# Patient Record
Sex: Female | Born: 1990 | Race: Black or African American | Hispanic: No | Marital: Single | Smoking: Never smoker
Health system: Southern US, Community
[De-identification: ages and names within clinical notes are randomized; demographics above are authoritative.]

## PROBLEM LIST (undated history)

## (undated) ENCOUNTER — Inpatient Hospital Stay: Payer: Self-pay

## (undated) DIAGNOSIS — Z789 Other specified health status: Secondary | ICD-10-CM

## (undated) DIAGNOSIS — E669 Obesity, unspecified: Secondary | ICD-10-CM

## (undated) DIAGNOSIS — Z8759 Personal history of other complications of pregnancy, childbirth and the puerperium: Secondary | ICD-10-CM

## (undated) HISTORY — DX: Personal history of other complications of pregnancy, childbirth and the puerperium: Z87.59

## (undated) HISTORY — DX: Obesity, unspecified: E66.9

## (undated) HISTORY — PX: MOUTH SURGERY: SHX715

---

## 2016-08-10 ENCOUNTER — Emergency Department
Admission: EM | Admit: 2016-08-10 | Discharge: 2016-08-10 | Disposition: A | Discharge status: Home / Self Care | Payer: Self-pay | Attending: Emergency Medicine | Admitting: Emergency Medicine

## 2016-08-10 DIAGNOSIS — N3001 Acute cystitis with hematuria: Secondary | ICD-10-CM | POA: Insufficient documentation

## 2016-08-10 LAB — URINALYSIS, COMPLETE (UACMP) WITH MICROSCOPIC
Bilirubin Urine: NEGATIVE
GLUCOSE, UA: NEGATIVE mg/dL
KETONES UR: NEGATIVE mg/dL
Nitrite: POSITIVE — AB
PROTEIN: 30 mg/dL — AB
Specific Gravity, Urine: 1.031 — ABNORMAL HIGH (ref 1.005–1.030)
pH: 5 (ref 5.0–8.0)

## 2016-08-10 LAB — POCT PREGNANCY, URINE: PREG TEST UR: NEGATIVE

## 2016-08-10 MED ORDER — SULFAMETHOXAZOLE-TRIMETHOPRIM 800-160 MG PO TABS
1.0000 | ORAL_TABLET | Freq: Once | ORAL | Status: AC
  Administered 2016-08-10: 1 via ORAL
  Filled 2016-08-10: qty 1

## 2016-08-10 MED ORDER — SULFAMETHOXAZOLE-TRIMETHOPRIM 800-160 MG PO TABS: 1.0000 | ORAL_TABLET | Freq: Two times a day (BID) | ORAL | 0 refills | Status: AC

## 2016-08-10 NOTE — ED Notes (Signed)

## 2016-08-10 NOTE — ED Provider Notes (Signed)
[facility] Emergency Department Provider Note  ____________________________________________  Time seen: Approximately 11:18 PM  I have reviewed the triage vital signs and the nursing notes.   HISTORY  Chief Complaint Urinary Frequency    HPI Eileen Parsons is a 26 y.o. female presenting to the emergency department with 1 day of dysuria, increased urinary frequency and suprapubic pain. Patient denies flank pain, nausea, vomiting, abdominal pain or fever. Patient states that her last urinary tract infection was "several years ago". No history of pyelonephritis or nephrolithiasis. No changes in vaginal discharge. No alleviating measures have been attempted.   No past medical history on file.  There are no active problems to display for this patient.   No past surgical history on file.  Prior to Admission medications   Medication Sig Start Date End Date Taking? Authorizing Provider  sulfamethoxazole-trimethoprim (BACTRIM DS,SEPTRA DS) 800-160 MG tablet Take 1 tablet by mouth 2 (two) times daily. 08/10/16 08/17/16  Orvil FeilWoods, Renato Spellman M, PA-C    Allergies Patient has no known allergies.  No family history on file.  Social History Social History  Substance Use Topics  . Smoking status: Not on file  . Smokeless tobacco: Not on file  . Alcohol use Not on file     Review of Systems  Constitutional: No fever/chills Eyes: No visual changes. No discharge ENT: No upper respiratory complaints. Cardiovascular: no chest pain. Respiratory: no cough. No SOB. Gastrointestinal: No abdominal pain.  No nausea, no vomiting.  No diarrhea.  No constipation. Genitourinary: Patient has dysuria and increased urinary frequency. Musculoskeletal: Negative for musculoskeletal pain. Skin: Negative for rash, abrasions, lacerations, ecchymosis. Neurological: Negative for headaches, focal weakness or numbness.  ____________________________________________   PHYSICAL  EXAM:  VITAL SIGNS: ED Triage Vitals  Enc Vitals Group     BP 08/10/16 2236 134/65     Pulse Rate 08/10/16 2232 86     Resp 08/10/16 2232 18     Temp 08/10/16 2232 99.1 F (37.3 C)     Temp Source 08/10/16 2232 Oral     SpO2 08/10/16 2232 98 %     Weight 08/10/16 2234 250 lb (113.4 kg)     Height 08/10/16 2234 5\' 7"  (1.702 m)     Head Circumference --      Peak Flow --      Pain Score 08/10/16 2231 5     Pain Loc --      Pain Edu? --      Excl. in GC? --      Constitutional: Alert and oriented. Well appearing and in no acute distress. Eyes: Conjunctivae are normal. PERRL. EOMI. Head: Atraumatic. Cardiovascular: Normal rate, regular rhythm. Normal S1 and S2.  Good peripheral circulation. Respiratory: Normal respiratory effort without tachypnea or retractions. Lungs CTAB. Good air entry to the bases with no decreased or absent breath sounds. Gastrointestinal: Bowel sounds 4 quadrants. Patient has suprapubic pain. No guarding or rigidity. No palpable masses. No distention. No CVA tenderness. Musculoskeletal: Full range of motion to all extremities. No gross deformities appreciated. Neurologic:  Normal speech and language. No gross focal neurologic deficits are appreciated.  Skin:  Skin is warm, dry and intact. No rash noted. Psychiatric: Mood and affect are normal. Speech and behavior are normal. Patient exhibits appropriate insight and judgement.   ____________________________________________   LABS (all labs ordered are listed, but only abnormal results are displayed)  Labs Reviewed  URINALYSIS, COMPLETE (UACMP) WITH MICROSCOPIC - Abnormal; Notable for the following:  Result Value   Color, Urine YELLOW (*)    APPearance CLOUDY (*)    Specific Gravity, Urine 1.031 (*)    Hgb urine dipstick MODERATE (*)    Protein, ur 30 (*)    Nitrite POSITIVE (*)    Leukocytes, UA LARGE (*)    Bacteria, UA RARE (*)    Squamous Epithelial / LPF 6-30 (*)    All other components  within normal limits  POC URINE PREG, ED  POCT PREGNANCY, URINE   ____________________________________________  EKG   ____________________________________________  RADIOLOGY   No results found.  ____________________________________________    PROCEDURES  Procedure(s) performed:    Procedures    Medications  sulfamethoxazole-trimethoprim (BACTRIM DS,SEPTRA DS) 800-160 MG per tablet 1 tablet (not administered)     ____________________________________________   INITIAL IMPRESSION / ASSESSMENT AND PLAN / ED COURSE  Pertinent labs & imaging results that were available during my care of the patient were reviewed by me and considered in my medical decision making (see chart for details).  Review of the [place] CSRS was performed in accordance of the [facility] prior to dispensing any controlled drugs.    Assessment and plan Acute cystitis Patient presents to the emergency department with dysuria and increased urinary frequency for 1 day. Urinalysis conducted in the emergency department is consistent with acute cystitis. As patient does not have a history of nephrolithiasis and is without bilateral flank pain, CT renal stone study was not conducted at this time. Strict return precautions were given to the patient. Patient was advised to return for the sudden onset of bilateral flank pain, nausea and vomiting. Patient voiced understanding regarding these return precautions. Vital signs are reassuring prior to discharge. All patient questions were answered.    ____________________________________________  FINAL CLINICAL IMPRESSION(S) / ED DIAGNOSES  Final diagnoses:  Acute cystitis with hematuria      NEW MEDICATIONS STARTED DURING THIS VISIT:  New Prescriptions   SULFAMETHOXAZOLE-TRIMETHOPRIM (BACTRIM DS,SEPTRA DS) 800-160 MG TABLET    Take 1 tablet by mouth 2 (two) times daily.        This chart was dictated using voice recognition software/Dragon. Despite best  efforts to proofread, errors can occur which can change the meaning. Any change was purely unintentional.    Orvil FeilWoods, Tausha Milhoan M, PA-C 08/10/16 2324    Don PerkingVeronese, [place], MD 08/10/16 [phone]

## 2016-08-10 NOTE — ED Notes (Signed)
poct pregnancy Negative 

## 2016-08-10 NOTE — ED Triage Notes (Signed)
Pt has urinary frequency for 1 day.  Intermittent back pain.  No vag bleeding.  Pt alert.

## 2017-03-14 ENCOUNTER — Other Ambulatory Visit: Payer: Self-pay

## 2017-03-14 ENCOUNTER — Encounter: Payer: Self-pay | Admitting: Intensive Care

## 2017-03-14 ENCOUNTER — Emergency Department
Admission: EM | Admit: 2017-03-14 | Discharge: 2017-03-14 | Disposition: A | Discharge status: Home / Self Care | Payer: Self-pay | Attending: Emergency Medicine | Admitting: Emergency Medicine

## 2017-03-14 ENCOUNTER — Emergency Department: Payer: Self-pay

## 2017-03-14 DIAGNOSIS — N3001 Acute cystitis with hematuria: Secondary | ICD-10-CM

## 2017-03-14 LAB — URINALYSIS, COMPLETE (UACMP) WITH MICROSCOPIC
Bacteria, UA: NONE SEEN
Bilirubin Urine: NEGATIVE
Glucose, UA: NEGATIVE mg/dL
Ketones, ur: NEGATIVE mg/dL
NITRITE: NEGATIVE
PH: 5 (ref 5.0–8.0)
Protein, ur: NEGATIVE mg/dL
Specific Gravity, Urine: 1.02 (ref 1.005–1.030)

## 2017-03-14 MED ORDER — PHENAZOPYRIDINE HCL 100 MG PO TABS: 100.0000 mg | ORAL_TABLET | Freq: Three times a day (TID) | ORAL | 0 refills | Status: AC | PRN

## 2017-03-14 MED ORDER — CEPHALEXIN 500 MG PO CAPS: 500.0000 mg | ORAL_CAPSULE | Freq: Two times a day (BID) | ORAL | 0 refills | Status: AC

## 2017-03-14 NOTE — ED Notes (Signed)
See triage note  States she developed dysuria and freq yesterday   Denies any fever or vaginal discharge

## 2017-03-14 NOTE — ED Provider Notes (Signed)
[facility] Emergency Department Provider Note  ____________________________________________  Time seen: Approximately 8:53 AM  I have reviewed the triage vital signs and the nursing notes.   HISTORY  Chief Complaint Urinary Tract Infection    HPI Eileen Parsons is a 27 y.o. female that presents to the emergency department for evaluation of dysuria for 1 day.  She has had some central back pain.  Last UTI was about 1 year ago.  She was told last year that she had blood in her urine when she had a UTI.  No history of kidney stones.  No fever, chills, nausea, vomiting, abdominal pain, vaginal discharge.  History reviewed. No pertinent past medical history.  There are no active problems to display for this patient.   History reviewed. No pertinent surgical history.  Prior to Admission medications   Medication Sig Start Date End Date Taking? Authorizing Provider  cephALEXin (KEFLEX) 500 MG capsule Take 1 capsule (500 mg total) by mouth 2 (two) times daily for 10 days. 03/14/17 03/24/17  Enid DerryWagner, Lilja Soland, PA-C  phenazopyridine (PYRIDIUM) 100 MG tablet Take 1 tablet (100 mg total) by mouth 3 (three) times daily as needed for up to 2 days for pain. 03/14/17 03/16/17  Enid DerryWagner, Shameika Speelman, PA-C    Allergies Patient has no known allergies.  History reviewed. No pertinent family history.  Social History Social History   Tobacco Use  . Smoking status: Never Smoker  . Smokeless tobacco: Never Used  Substance Use Topics  . Alcohol use: Yes    Comment: weekends  . Drug use: No     Review of Systems  Constitutional: No fever/chills ENT: No upper respiratory complaints. Cardiovascular: No chest pain. Respiratory: No SOB. Gastrointestinal: No abdominal pain.  No nausea, no vomiting.  Genitourinary: Positive for dysuria. Musculoskeletal: Positive for back pain Skin: Negative for rash, abrasions, lacerations,  ecchymosis.   ____________________________________________   PHYSICAL EXAM:  VITAL SIGNS: ED Triage Vitals  Enc Vitals Group     BP 03/14/17 0753 (!) 119/53     Pulse Rate 03/14/17 0753 82     Resp 03/14/17 0753 16     Temp 03/14/17 0753 98.1 F (36.7 C)     Temp Source 03/14/17 0753 Oral     SpO2 03/14/17 0753 100 %     Weight 03/14/17 0750 240 lb (108.9 kg)     Height 03/14/17 0750 5\' 7"  (1.702 m)     Head Circumference --      Peak Flow --      Pain Score 03/14/17 0750 5     Pain Loc --      Pain Edu? --      Excl. in GC? --      Constitutional: Alert and oriented. Well appearing and in no acute distress. Eyes: Conjunctivae are normal. PERRL. EOMI. Head: Atraumatic. ENT:      Ears:      Nose: No congestion/rhinnorhea.      Mouth/Throat: Mucous membranes are moist.  Neck: No stridor.  Cardiovascular: Normal rate, regular rhythm.  Good peripheral circulation. Respiratory: Normal respiratory effort without tachypnea or retractions. Lungs CTAB. Good air entry to the bases with no decreased or absent breath sounds. Gastrointestinal: Bowel sounds 4 quadrants. Soft and nontender to palpation. No guarding or rigidity. No palpable masses. No distention. No CVA tenderness. Musculoskeletal: Full range of motion to all extremities. No gross deformities appreciated.  Mild tenderness to palpation to mid lower back.   Neurologic:  Normal speech and language.  No gross focal neurologic deficits are appreciated.  Skin:  Skin is warm, dry and intact. No rash noted.   ____________________________________________   LABS (all labs ordered are listed, but only abnormal results are displayed)  Labs Reviewed  URINALYSIS, COMPLETE (UACMP) WITH MICROSCOPIC - Abnormal; Notable for the following components:      Result Value   Color, Urine YELLOW (*)    APPearance HAZY (*)    Hgb urine dipstick MODERATE (*)    Leukocytes, UA SMALL (*)    Squamous Epithelial / LPF 0-5 (*)    All other  components within normal limits  POC URINE PREG, ED   ____________________________________________  EKG   ____________________________________________  RADIOLOGY   Ct Renal Stone Study  Result Date: 03/14/2017 CLINICAL DATA:  Hematuria.  Dysuria. EXAM: CT ABDOMEN AND PELVIS WITHOUT CONTRAST TECHNIQUE: Multidetector CT imaging of the abdomen and pelvis was performed following the standard protocol without IV contrast. COMPARISON:  None. FINDINGS: Lower chest: No significant pulmonary nodules or acute consolidative airspace disease. Hepatobiliary: Normal liver size. No liver mass. Cholelithiasis. No gallbladder distention, gallbladder wall thickening or pericholecystic fluid. No biliary ductal dilatation. Pancreas: Normal, with no mass or duct dilation. Spleen: Normal size. No mass. Adrenals/Urinary Tract: Normal adrenals. No hydronephrosis. No renal stones. No contour deforming renal masses. Normal caliber ureters, with no ureteral stones. Normal nondistended bladder. Stomach/Bowel: Normal non-distended stomach. Normal caliber small bowel with no small bowel wall thickening. Normal appendix. Normal large bowel with no diverticulosis, large bowel wall thickening or pericolonic fat stranding. Vascular/Lymphatic: Normal caliber abdominal aorta. No pathologically enlarged lymph nodes in the abdomen or pelvis. Reproductive: Normal uterus.  No adnexal mass. Other: No pneumoperitoneum, ascites or focal fluid collection. Musculoskeletal: No aggressive appearing focal osseous lesions. IMPRESSION: No acute abnormality.  No hydronephrosis.  No urolithiasis. Cholelithiasis. Electronically Signed   By: Delbert Phenix M.D.   On: 03/14/2017 09:24    ____________________________________________    PROCEDURES  Procedure(s) performed:    Procedures    Medications - No data to display   ____________________________________________   INITIAL IMPRESSION / ASSESSMENT AND PLAN / ED COURSE  Pertinent  labs & imaging results that were available during my care of the patient were reviewed by me and considered in my medical decision making (see chart for details).  Review of the [place] CSRS was performed in accordance of the [facility] prior to dispensing any controlled drugs.   Patient's diagnosis is consistent with UTI.  Vital signs and exam are reassuring.  Urinalysis shows hematuria and leukocytes.  CT renal stone study negative for nephrolithiasis.  Patient will be discharged home with prescriptions for Keflex and Pyridium. Patient is to follow up with PCP as directed. Patient is given ED precautions to return to the ED for any worsening or new symptoms.     ____________________________________________  FINAL CLINICAL IMPRESSION(S) / ED DIAGNOSES  Final diagnoses:  Acute cystitis with hematuria      NEW MEDICATIONS STARTED DURING THIS VISIT:  ED Discharge Orders        Ordered    cephALEXin (KEFLEX) 500 MG capsule  2 times daily     03/14/17 1007    phenazopyridine (PYRIDIUM) 100 MG tablet  3 times daily PRN     03/14/17 1007          This chart was dictated using voice recognition software/Dragon. Despite best efforts to proofread, errors can occur which can change the meaning. Any change was purely unintentional.    Enid Derry,  PA-C 03/14/17 1030    Rockne Menghini, MD 03/14/17 1309

## 2017-03-14 NOTE — ED Triage Notes (Signed)
Patient c/o burning and slight discomfort when urinating. Denies abnormal d/c

## 2017-03-14 NOTE — ED Notes (Signed)
POC urine pregnancy negative. 

## 2017-03-16 LAB — POCT PREGNANCY, URINE: Preg Test, Ur: NEGATIVE

## 2017-04-27 ENCOUNTER — Encounter: Payer: Self-pay | Admitting: Emergency Medicine

## 2017-04-27 ENCOUNTER — Emergency Department
Admission: EM | Admit: 2017-04-27 | Discharge: 2017-04-27 | Disposition: A | Discharge status: Home / Self Care | Payer: Medicaid Other | Attending: Emergency Medicine | Admitting: Emergency Medicine

## 2017-04-27 ENCOUNTER — Other Ambulatory Visit: Payer: Self-pay

## 2017-04-27 DIAGNOSIS — R109 Unspecified abdominal pain: Secondary | ICD-10-CM | POA: Diagnosis not present

## 2017-04-27 DIAGNOSIS — Z3A00 Weeks of gestation of pregnancy not specified: Secondary | ICD-10-CM | POA: Diagnosis not present

## 2017-04-27 DIAGNOSIS — O26899 Other specified pregnancy related conditions, unspecified trimester: Secondary | ICD-10-CM | POA: Diagnosis present

## 2017-04-27 DIAGNOSIS — R110 Nausea: Secondary | ICD-10-CM | POA: Diagnosis not present

## 2017-04-27 DIAGNOSIS — O26891 Other specified pregnancy related conditions, first trimester: Secondary | ICD-10-CM | POA: Insufficient documentation

## 2017-04-27 DIAGNOSIS — Z3491 Encounter for supervision of normal pregnancy, unspecified, first trimester: Secondary | ICD-10-CM

## 2017-04-27 LAB — URINALYSIS, COMPLETE (UACMP) WITH MICROSCOPIC
BACTERIA UA: NONE SEEN
BILIRUBIN URINE: NEGATIVE
Glucose, UA: NEGATIVE mg/dL
Ketones, ur: NEGATIVE mg/dL
LEUKOCYTES UA: NEGATIVE
Nitrite: NEGATIVE
Protein, ur: NEGATIVE mg/dL
SPECIFIC GRAVITY, URINE: 1.025 (ref 1.005–1.030)
pH: 6 (ref 5.0–8.0)

## 2017-04-27 LAB — CBC
HEMATOCRIT: 39.1 % (ref 35.0–47.0)
Hemoglobin: 13.4 g/dL (ref 12.0–16.0)
MCH: 28.8 pg (ref 26.0–34.0)
MCHC: 34.3 g/dL (ref 32.0–36.0)
MCV: 84 fL (ref 80.0–100.0)
Platelets: 397 10*3/uL (ref 150–440)
RBC: 4.65 MIL/uL (ref 3.80–5.20)
RDW: 13.3 % (ref 11.5–14.5)
WBC: 10.9 10*3/uL (ref 3.6–11.0)

## 2017-04-27 LAB — COMPREHENSIVE METABOLIC PANEL
ALBUMIN: 4 g/dL (ref 3.5–5.0)
ALT: 14 U/L (ref 14–54)
AST: 20 U/L (ref 15–41)
Alkaline Phosphatase: 66 U/L (ref 38–126)
Anion gap: 7 (ref 5–15)
BUN: 12 mg/dL (ref 6–20)
CHLORIDE: 102 mmol/L (ref 101–111)
CO2: 26 mmol/L (ref 22–32)
CREATININE: 0.6 mg/dL (ref 0.44–1.00)
Calcium: 9 mg/dL (ref 8.9–10.3)
GFR calc Af Amer: >60 mL/min (ref >60)
GFR calc non Af Amer: >60 mL/min (ref >60)
GLUCOSE: 83 mg/dL (ref 65–99)
POTASSIUM: 3.8 mmol/L (ref 3.5–5.1)
Sodium: 135 mmol/L (ref 135–145)
Total Bilirubin: 0.4 mg/dL (ref 0.3–1.2)
Total Protein: 8.2 g/dL — ABNORMAL HIGH (ref 6.5–8.1)

## 2017-04-27 LAB — LIPASE, BLOOD: LIPASE: 25 U/L (ref 11–51)

## 2017-04-27 LAB — POCT PREGNANCY, URINE: PREG TEST UR: POSITIVE — AB

## 2017-04-27 MED ORDER — PRENATAL 19 PO TABS
1.0000 | ORAL_TABLET | Freq: Every day | ORAL | 6 refills | Status: DC
Start: 2017-04-27 — End: 2018-03-27

## 2017-04-27 MED ORDER — ONDANSETRON 4 MG PO TBDP
4.0000 mg | ORAL_TABLET | Freq: Once | ORAL | Status: AC
  Administered 2017-04-27: 4 mg via ORAL
  Filled 2017-04-27: qty 1

## 2017-04-27 MED ORDER — ONDANSETRON 4 MG PO TBDP: 4.0000 mg | ORAL_TABLET | Freq: Three times a day (TID) | ORAL | 0 refills | Status: DC | PRN

## 2017-04-27 NOTE — ED Triage Notes (Signed)
FIRST NURSE NOTE-here for abdominal pain. Ambulatory. NAD

## 2017-04-27 NOTE — ED Provider Notes (Signed)
[facility] Emergency Department Provider Note       Time seen: ----------------------------------------- 8:28 PM on 04/27/2017 -----------------------------------------   I have reviewed the triage vital signs and the nursing notes.  HISTORY   Chief Complaint Abdominal Pain    HPI Eileen Parsons is a 27 y.o. female with no significant past medical history who presents to the ED for abdominal pain and nausea for the past week.  Patient denies any vomiting or diarrhea or urinary frequency.  Nothing makes her symptoms better or worse.  Patient states she could possibly be pregnant, she is not actively trying not to get pregnant.  History reviewed. No pertinent past medical history.  There are no active problems to display for this patient.   History reviewed. No pertinent surgical history.  Allergies Patient has no known allergies.  Social History Social History   Tobacco Use  . Smoking status: Never Smoker  . Smokeless tobacco: Never Used  Substance Use Topics  . Alcohol use: Yes    Comment: weekends  . Drug use: No   Review of Systems Constitutional: Negative for fever. Cardiovascular: Negative for chest pain. Respiratory: Negative for shortness of breath. Gastrointestinal: Positive for abdominal pain, nausea Genitourinary: Negative for dysuria. Musculoskeletal: Negative for back pain. Skin: Negative for rash. Neurological: Negative for headaches, focal weakness or numbness.  All systems negative/normal/unremarkable except as stated in the HPI  ____________________________________________   PHYSICAL EXAM:  VITAL SIGNS: ED Triage Vitals [04/27/17 1833]  Enc Vitals Group     BP      Pulse      Resp      Temp      Temp src      SpO2      Weight 250 lb (113.4 kg)     Height 5\' 8"  (1.727 m)     Head Circumference      Peak Flow      Pain Score 5     Pain Loc      Pain Edu?      Excl. in GC?    Constitutional: Alert and  oriented. Well appearing and in no distress. Eyes: Conjunctivae are normal. Normal extraocular movements. ENT   Head: Normocephalic and atraumatic.   Nose: No congestion/rhinnorhea.   Mouth/Throat: Mucous membranes are moist.   Neck: No stridor. Cardiovascular: Normal rate, regular rhythm. No murmurs, rubs, or gallops. Respiratory: Normal respiratory effort without tachypnea nor retractions. Breath sounds are clear and equal bilaterally. No wheezes/rales/rhonchi. Gastrointestinal: Soft and nontender. Normal bowel sounds Musculoskeletal: Nontender with normal range of motion in extremities. No lower extremity tenderness nor edema. Neurologic:  Normal speech and language. No gross focal neurologic deficits are appreciated.  Skin:  Skin is warm, dry and intact. No rash noted. Psychiatric: Mood and affect are normal. Speech and behavior are normal.  ____________________________________________  ED COURSE:  As part of my medical decision making, I reviewed the following data within the electronic MEDICAL RECORD NUMBER History obtained from family if available, nursing notes, old chart and ekg, as well as notes from prior ED visits. Patient presented for abdominal pain and nausea, we will assess with labs and imaging as indicated at this time.   Procedures ____________________________________________   LABS (pertinent positives/negatives)  Labs Reviewed  COMPREHENSIVE METABOLIC PANEL - Abnormal; Notable for the following components:      Result Value   Total Protein 8.2 (*)    All other components within normal limits  URINALYSIS, COMPLETE (UACMP) WITH MICROSCOPIC -  Abnormal; Notable for the following components:   Color, Urine YELLOW (*)    APPearance HAZY (*)    Hgb urine dipstick MODERATE (*)    Squamous Epithelial / LPF 0-5 (*)    All other components within normal limits  POCT PREGNANCY, URINE - Abnormal; Notable for the following components:   Preg Test, Ur POSITIVE (*)     All other components within normal limits  LIPASE, BLOOD  CBC  POC URINE PREG, ED  ____________________________________________  DIFFERENTIAL DIAGNOSIS   Pregnancy, UTI, gastroenteritis, GERD  FINAL ASSESSMENT AND PLAN  First trimester pregnancy   Plan: The patient had presented for abdominal pain and nausea which is likely morning sickness. Patient's labs did reveal that she is pregnant but all the other tests look normal.  Did not require an ultrasound at this time.  She was started on prenatal vitamins, antiemetics and she is stable for outpatient follow-up.   Ulice Dash, MD   Note: This note was generated in part or whole with voice recognition software. Voice recognition is usually quite accurate but there are transcription errors that can and very often do occur. I apologize for any typographical errors that were not detected and corrected.     Emily Filbert, MD 04/27/17 2030

## 2017-04-27 NOTE — ED Triage Notes (Signed)
Pt c/o mid abdominal pain and nausea x 1 week. Pt denies any vomiting or diarrhea, pt c/o urinary frequency, denies burning. Pt playing on phone during triage.

## 2017-04-27 NOTE — ED Notes (Signed)

## 2017-07-05 ENCOUNTER — Emergency Department
Admission: EM | Admit: 2017-07-05 | Discharge: 2017-07-05 | Disposition: A | Discharge status: Home / Self Care | Payer: Medicaid Other | Attending: Emergency Medicine | Admitting: Emergency Medicine

## 2017-07-05 ENCOUNTER — Other Ambulatory Visit: Payer: Self-pay

## 2017-07-05 ENCOUNTER — Emergency Department: Payer: Medicaid Other

## 2017-07-05 DIAGNOSIS — O468X1 Other antepartum hemorrhage, first trimester: Secondary | ICD-10-CM

## 2017-07-05 DIAGNOSIS — O4690 Antepartum hemorrhage, unspecified, unspecified trimester: Secondary | ICD-10-CM | POA: Diagnosis not present

## 2017-07-05 DIAGNOSIS — O219 Vomiting of pregnancy, unspecified: Secondary | ICD-10-CM

## 2017-07-05 DIAGNOSIS — O0991 Supervision of high risk pregnancy, unspecified, first trimester: Secondary | ICD-10-CM | POA: Diagnosis not present

## 2017-07-05 DIAGNOSIS — O418X10 Other specified disorders of amniotic fluid and membranes, first trimester, not applicable or unspecified: Secondary | ICD-10-CM

## 2017-07-05 DIAGNOSIS — R42 Dizziness and giddiness: Secondary | ICD-10-CM | POA: Diagnosis not present

## 2017-07-05 DIAGNOSIS — Z3A01 Less than 8 weeks gestation of pregnancy: Secondary | ICD-10-CM

## 2017-07-05 DIAGNOSIS — Z8759 Personal history of other complications of pregnancy, childbirth and the puerperium: Secondary | ICD-10-CM | POA: Insufficient documentation

## 2017-07-05 DIAGNOSIS — IMO0001 Reserved for inherently not codable concepts without codable children: Secondary | ICD-10-CM

## 2017-07-05 LAB — URINALYSIS, COMPLETE (UACMP) WITH MICROSCOPIC
BACTERIA UA: NONE SEEN
Bilirubin Urine: NEGATIVE
GLUCOSE, UA: NEGATIVE mg/dL
Hgb urine dipstick: NEGATIVE
Ketones, ur: NEGATIVE mg/dL
NITRITE: NEGATIVE
PH: 8 (ref 5.0–8.0)
Protein, ur: NEGATIVE mg/dL
Specific Gravity, Urine: 1.015 (ref 1.005–1.030)

## 2017-07-05 LAB — BASIC METABOLIC PANEL
ANION GAP: 8 (ref 5–15)
BUN: 8 mg/dL (ref 6–20)
CALCIUM: 9 mg/dL (ref 8.9–10.3)
CO2: 23 mmol/L (ref 22–32)
Chloride: 103 mmol/L (ref 101–111)
Creatinine, Ser: 0.52 mg/dL (ref 0.44–1.00)
Glucose, Bld: 87 mg/dL (ref 65–99)
POTASSIUM: 3.7 mmol/L (ref 3.5–5.1)
SODIUM: 134 mmol/L — AB (ref 135–145)

## 2017-07-05 LAB — CBC
HEMATOCRIT: 42.3 % (ref 35.0–47.0)
HEMOGLOBIN: 14.2 g/dL (ref 12.0–16.0)
MCH: 28.1 pg (ref 26.0–34.0)
MCHC: 33.5 g/dL (ref 32.0–36.0)
MCV: 84 fL (ref 80.0–100.0)
Platelets: 446 10*3/uL — ABNORMAL HIGH (ref 150–440)
RBC: 5.04 MIL/uL (ref 3.80–5.20)
RDW: 13.5 % (ref 11.5–14.5)
WBC: 11.1 10*3/uL — ABNORMAL HIGH (ref 3.6–11.0)

## 2017-07-05 LAB — HCG, QUANTITATIVE, PREGNANCY: hCG, Beta Chain, Quant, S: 78113 m[IU]/mL — ABNORMAL HIGH (ref <5)

## 2017-07-05 LAB — POCT PREGNANCY, URINE: Preg Test, Ur: POSITIVE — AB

## 2017-07-05 MED ORDER — ONDANSETRON 4 MG PO TBDP: 4.0000 mg | ORAL_TABLET | Freq: Three times a day (TID) | ORAL | 0 refills | Status: DC | PRN

## 2017-07-05 MED ORDER — SODIUM CHLORIDE 0.9 % IV BOLUS
1000.0000 mL | Freq: Once | INTRAVENOUS | Status: AC
  Administered 2017-07-05: 1000 mL via INTRAVENOUS

## 2017-07-05 MED ORDER — ONDANSETRON HCL 4 MG/2ML IJ SOLN
4.0000 mg | Freq: Once | INTRAMUSCULAR | Status: AC
  Administered 2017-07-05: 4 mg via INTRAVENOUS
  Filled 2017-07-05: qty 2

## 2017-07-05 MED ORDER — VITAMIN B-6 25 MG PO TABS: 25.0000 mg | ORAL_TABLET | Freq: Three times a day (TID) | ORAL | 0 refills | Status: DC | PRN

## 2017-07-05 NOTE — ED Notes (Signed)
Pt given saltines and water. 

## 2017-07-05 NOTE — ED Notes (Signed)
Informed RN that patient has been roomed and is ready for evaluation.  Patient in NAD at this time and call bell placed within reach.   

## 2017-07-05 NOTE — ED Notes (Signed)
Pt back from US

## 2017-07-05 NOTE — Discharge Instructions (Addendum)
Please drink plenty of fluid to stay well hydrated and take a clear liquid diet for the next 12-24 hours, then advance to a bland diet as tolerated.  Return to the emergency department for severe pain, vaginal bleeding or gush of fluid, inability to keep down fluids, lightheadedness or fainting or any other symptoms concerning to you.

## 2017-07-05 NOTE — ED Provider Notes (Signed)
[facility] Emergency Department Provider Note  ____________________________________________  Time seen: Approximately 2:02 PM  I have reviewed the triage vital signs and the nursing notes.   HISTORY  Chief Complaint Dizziness and Emesis    HPI Eileen Parsons is a 27 y.o. female G42P1A2 with recent history of miscarriage presenting for dizziness, nausea and vomiting.  The patient reports that yesterday she was at work and felt lightheaded with standing and had multiple episodes of nausea and vomiting.  She came home and rested, but her symptoms have persisted today.  She had a miscarriage 04/27/2017 and was followed by her gynecologist until her hCG return to 0.  Today, the patient has a positive urine pregnancy test with an hCG of 75,000.  She reports that she is sexually active without the use of any contraception.  Has noted bilateral breast tenderness.  No abdominal pain, vaginal discharge, vaginal bleeding, fevers.  History reviewed. No pertinent past medical history.  There are no active problems to display for this patient.   History reviewed. No pertinent surgical history.  Current Outpatient Rx  . Order #: [id number] Class: Print  . Order #: [id number] Class: Print  . Order #: [id number] Class: Print    Allergies Patient has no known allergies.  No family history on file.  Social History Social History   Tobacco Use  . Smoking status: Never Smoker  . Smokeless tobacco: Never Used  Substance Use Topics  . Alcohol use: Yes    Comment: weekends  . Drug use: No    Review of Systems Constitutional: No fever/chills.  Positive lightheadedness without syncope.  As of general malaise. Eyes: No visual changes. ENT: No sore throat. No congestion or rhinorrhea. Cardiovascular: Denies chest pain. Denies palpitations.  Positive breast tenderness. Respiratory: Denies shortness of breath.  No cough. Gastrointestinal: No abdominal pain.  Positive nausea,  positive vomiting.  No diarrhea.  No constipation. Genitourinary: Negative for dysuria.  No change in vaginal discharge.  No vaginal bleeding. Musculoskeletal: Negative for back pain. Skin: Negative for rash. Neurological: Negative for headaches. No focal numbness, tingling or weakness.     ____________________________________________   PHYSICAL EXAM:  VITAL SIGNS: ED Triage Vitals [07/05/17 1148]  Enc Vitals Group     BP 128/70     Pulse Rate 85     Resp 16     Temp 98.1 F (36.7 C)     Temp Source Oral     SpO2 98 %     Weight 258 lb (117 kg)     Height 5\' 7"  (1.702 m)     Head Circumference      Peak Flow      Pain Score 0     Pain Loc      Pain Edu?      Excl. in GC?     Constitutional: Alert and oriented.  Answers questions appropriately. Eyes: Conjunctivae are normal.  EOMI. No scleral icterus. Head: Atraumatic. Nose: No congestion/rhinnorhea. Mouth/Throat: Mucous membranes are mildly dry.  Neck: No stridor.  Supple.  No JVD.  No meningismus. Cardiovascular: Normal rate, regular rhythm. No murmurs, rubs or gallops.  Respiratory: Normal respiratory effort.  No accessory muscle use or retractions. Lungs CTAB.  No wheezes, rales or ronchi. Gastrointestinal: Soft, nontender and nondistended.  No guarding or rebound.  No peritoneal signs. Genitourinary: Deferred as the patient is not having any vaginal symptoms today. Musculoskeletal: No LE edema. Neurologic:  A&Ox3.  Speech is clear.  Face and smile are symmetric.  EOMI.  Moves all extremities well. Skin:  Skin is warm, dry and intact. No rash noted. Psychiatric: Mood and affect are normal. Speech and behavior are normal.  Normal judgement.  ____________________________________________   LABS (all labs ordered are listed, but only abnormal results are displayed)  Labs Reviewed  BASIC METABOLIC PANEL - Abnormal; Notable for the following components:      Result Value   Sodium 134 (*)    All other components  within normal limits  CBC - Abnormal; Notable for the following components:   WBC 11.1 (*)    Platelets 446 (*)    All other components within normal limits  URINALYSIS, COMPLETE (UACMP) WITH MICROSCOPIC - Abnormal; Notable for the following components:   Color, Urine YELLOW (*)    APPearance CLEAR (*)    Leukocytes, UA TRACE (*)    All other components within normal limits  HCG, QUANTITATIVE, PREGNANCY - Abnormal; Notable for the following components:   hCG, Beta Chain, Quant, S 78,113 (*)    All other components within normal limits  POCT PREGNANCY, URINE - Abnormal; Notable for the following components:   Preg Test, Ur POSITIVE (*)    All other components within normal limits  POC URINE PREG, ED   ____________________________________________  EKG  ED ECG REPORT I, Rockne MenghiniNorman, Anne-Caroline, the attending physician, personally viewed and interpreted this ECG.   Date: 07/05/2017  EKG Time: 1150  Rate: 78  Rhythm: normal sinus rhythm  Axis: normal  Intervals:none  ST&T Change: No STEMI  ____________________________________________  RADIOLOGY  [place] Ob Comp Less 14 Wks  Result Date: 07/05/2017 CLINICAL DATA:  Dizziness. Emesis. Status post miscarriage on 05/03/2017. Quantitative beta HCG 78,113. EXAM: OBSTETRIC <14 WK US AND TRANSVAGINAL OB US TECHNIQUE: Both transabdominal and transvaginal ultrasound examinations were performed for complete evaluation of the gestation as well as the maternal uterus, adnexal regions, and pelvic cul-de-sac. Transvaginal technique was performed to assess early pregnancy. COMPARISON:  Abdomen and pelvis CT dated 03/14/2017. FINDINGS: Intrauterine gestational sac: Visualized Yolk sac:  Visualized Embryo:  Visualized Cardiac Activity: Visualized Heart Rate: 157 bpm CRL:  12.6 mm   7 w   3 d                  US EDC: 02/18/2018 Subchorionic hemorrhage:  Small. Maternal uterus/adnexae: Normal appearing ovaries with a right ovarian corpus luteum noted.  IMPRESSION: 1. Single live intrauterine gestation with an estimated gestational age of [redacted] weeks and 3 days. 2. Small subchorionic hemorrhage. Electronically Signed   By: Beckie SaltsSteven  Reid M.D.   On: 07/05/2017 16:16   [place] Ob Transvaginal  Result Date: 07/05/2017 CLINICAL DATA:  Dizziness. Emesis. Status post miscarriage on 05/03/2017. Quantitative beta HCG 78,113. EXAM: OBSTETRIC <14 WK US AND TRANSVAGINAL OB US TECHNIQUE: Both transabdominal and transvaginal ultrasound examinations were performed for complete evaluation of the gestation as well as the maternal uterus, adnexal regions, and pelvic cul-de-sac. Transvaginal technique was performed to assess early pregnancy. COMPARISON:  Abdomen and pelvis CT dated 03/14/2017. FINDINGS: Intrauterine gestational sac: Visualized Yolk sac:  Visualized Embryo:  Visualized Cardiac Activity: Visualized Heart Rate: 157 bpm CRL:  12.6 mm   7 w   3 d                  US EDC: 02/18/2018 Subchorionic hemorrhage:  Small. Maternal uterus/adnexae: Normal appearing ovaries with a right ovarian corpus luteum noted. IMPRESSION: 1. Single live intrauterine gestation with an estimated gestational age of [redacted] weeks and  3 days. 2. Small subchorionic hemorrhage. Electronically Signed   By: Beckie Salts M.D.   On: 07/05/2017 16:16    ____________________________________________   PROCEDURES  Procedure(s) performed: None  Procedures  Critical Care performed: No ____________________________________________   INITIAL IMPRESSION / ASSESSMENT AND PLAN / ED COURSE  Pertinent labs & imaging results that were available during my care of the patient were reviewed by me and considered in my medical decision making (see chart for details).  27 y.o. female G4 P1 a 2 with recent miscarriage presenting with lightheadedness, nausea and vomiting, positive pregnancy test with hCG of 75,000.  Overall, the patient's symptoms can be due to early pregnancy.  We will get basic laboratory studies to  rule out anemia or electrolyte abnormality.  We will rule out UTI.  The patient will receive intravenous fluids and antiemetic for her symptoms.  She will undergo ultrasonography to confirm IUP.  Plan reevaluation for final disposition.  ----------------------------------------- 4:40 PM on 07/05/2017 -----------------------------------------  Patient continues to be hemodynamically stable and is feeling well.  Her ultrasound does show a live IUP that is measuring 7 weeks 3 days.  She has a small subchorionic hemorrhage, which I have discussed with her.  At this time, the patient is stable for discharge home.  She is able to tolerate liquids without any difficulty and she is able to stand without any lightheadedness.  Follow-up instructions as well as return precautions were discussed  ____________________________________________  FINAL CLINICAL IMPRESSION(S) / ED DIAGNOSES  Final diagnoses:  Lightheadedness  Nausea and vomiting in pregnancy prior to [redacted] weeks gestation  Less than [redacted] weeks gestation of pregnancy  Subchorionic hemorrhage of placenta in first trimester, single or unspecified fetus         NEW MEDICATIONS STARTED DURING THIS VISIT:  New Prescriptions   ONDANSETRON (ZOFRAN ODT) 4 MG DISINTEGRATING TABLET    Take 1 tablet (4 mg total) by mouth every 8 (eight) hours as needed for nausea or vomiting.   PYRIDOXINE (PYRIDOXINE) 25 MG TABLET    Take 1-2 tablets (25-50 mg total) by mouth every 8 (eight) hours as needed.      Rockne Menghini, MD 07/05/17 [phone]

## 2017-07-05 NOTE — ED Triage Notes (Signed)
Pt to ER via POV c/o dizziness and emesis that began yesterday. Denies abdominal pain. Pt alert and oriented X4, active, cooperative, pt in NAD. RR even and unlabored, color WNL.

## 2017-07-14 ENCOUNTER — Ambulatory Visit (INDEPENDENT_AMBULATORY_CARE_PROVIDER_SITE_OTHER): Payer: Medicaid Other | Admitting: Obstetrics and Gynecology

## 2017-07-14 ENCOUNTER — Encounter: Payer: Self-pay | Admitting: Obstetrics and Gynecology

## 2017-07-14 VITALS — BP 122/78 | HR 90 | Ht 67.0 in | Wt 260.0 lb

## 2017-07-14 DIAGNOSIS — O219 Vomiting of pregnancy, unspecified: Secondary | ICD-10-CM | POA: Diagnosis not present

## 2017-07-14 DIAGNOSIS — Z3A08 8 weeks gestation of pregnancy: Secondary | ICD-10-CM

## 2017-07-14 MED ORDER — DOXYLAMINE-PYRIDOXINE ER 20-20 MG PO TBCR: 1.0000 | EXTENDED_RELEASE_TABLET | Freq: Two times a day (BID) | ORAL | 3 refills | Status: AC

## 2017-07-14 NOTE — Progress Notes (Signed)
Patient ID: Eileen Parsons, female   DOB: 09/21/1990, 27 y.o.   MRN: [id number]  Reason for Consult: Follow-up (ER. Still n/v )   Referred by No ref. provider found  Subjective:     HPI:  Eileen Parsons is a 27 y.o. female she presents to the office today to follow up from her recent ER visit. She was given zofran for nausea but her symptoms have not improved. She is having vomiting about twice a day. She has not lost weight. She is able to tolerate liquids. Some smells make her nausea worse. She has a low appetite. She denies any vaginal bleeding. She is taking a prenatal vitamin.   History reviewed. No pertinent past medical history. History reviewed. No pertinent family history. History reviewed. No pertinent surgical history.  Short Social History:  Social History   Tobacco Use  . Smoking status: Never Smoker  . Smokeless tobacco: Never Used  Substance Use Topics  . Alcohol use: Yes    Comment: weekends    No Known Allergies  Current Outpatient Medications  Medication Sig Dispense Refill  . ondansetron (ZOFRAN ODT) 4 MG disintegrating tablet Take 1 tablet (4 mg total) by mouth every 8 (eight) hours as needed for nausea or vomiting. 20 tablet 0  . Prenatal Vit-DSS-Fe Fum-FA (PRENATAL 19) tablet Take 1 tablet by mouth daily. 30 tablet 6  . promethazine (PHENERGAN) 25 MG tablet Take 25 mg by mouth every 6 (six) hours as needed. for nausea  0  . Doxylamine-Pyridoxine ER (BONJESTA) 20-20 MG TBCR Take 1 tablet by mouth 2 (two) times daily. Sig 1 tab po  daily at bedtime, 1 tab po daily morning 60 tablet 3  . pyridOXINE (PYRIDOXINE) 25 MG tablet Take 1-2 tablets (25-50 mg total) by mouth every 8 (eight) hours as needed. (Patient not taking: Reported on 07/14/2017) 20 tablet 0   No current facility-administered medications for this visit.     Review of Systems  Constitutional: Negative for chills, fatigue, fever and unexpected weight change.  HENT: Negative for trouble swallowing.    Eyes: Negative for loss of vision.  Respiratory: Negative for cough, shortness of breath and wheezing.  Cardiovascular: Negative for chest pain, leg swelling, palpitations and syncope.  GI: Negative for abdominal pain, blood in stool, diarrhea, nausea and vomiting.  GU: Negative for difficulty urinating, dysuria, frequency and hematuria.  Musculoskeletal: Negative for back pain, leg pain and joint pain.  Skin: Negative for rash.  Neurological: Negative for dizziness, headaches, light-headedness, numbness and seizures.  Psychiatric: Negative for behavioral problem, confusion, depressed mood and sleep disturbance.        Objective:  Objective   Vitals:   07/14/17 1527  BP: 122/78  Pulse: 90  Weight: 260 lb (117.9 kg)  Height: 5\' 7"  (1.702 m)   Body mass index is 40.72 kg/m.  Physical Exam  Constitutional: She is oriented to person, place, and time. She appears well-developed and well-nourished.  HENT:  Head: Normocephalic and atraumatic.  Eyes: EOM are normal.  Cardiovascular: Normal rate, regular rhythm and normal heart sounds.  Pulmonary/Chest: Effort normal and breath sounds normal.  Neurological: She is alert and oriented to person, place, and time.  Skin: Skin is warm and dry.  Psychiatric: She has a normal mood and affect. Her behavior is normal. Judgment and thought content normal.  Nursing note and vitals reviewed.       Assessment/Plan:       27 yo at 8 week 5 days by a  7 week ultrasound.  Discussed BRAT diet. Recommended small frequent means. Encouraged hydration. [facility]  She is interested in establishing care for this pregnancy.  She will follow up in 1 week for a new OB appointment.  Adelene Idlerhristanna Lada Fulbright MD [facility], [facility] 07/14/17 3:55 PM

## 2017-07-24 ENCOUNTER — Encounter: Payer: Self-pay | Admitting: Obstetrics and Gynecology

## 2017-07-24 ENCOUNTER — Other Ambulatory Visit (HOSPITAL_COMMUNITY)
Admission: RE | Admit: 2017-07-24 | Discharge: 2017-07-24 | Disposition: A | Discharge status: Home / Self Care | Payer: Medicaid Other | Source: Ambulatory Visit | Attending: Obstetrics and Gynecology | Admitting: Obstetrics and Gynecology

## 2017-07-24 ENCOUNTER — Ambulatory Visit (INDEPENDENT_AMBULATORY_CARE_PROVIDER_SITE_OTHER): Payer: BLUE CROSS/BLUE SHIELD | Admitting: Certified Nurse Midwife

## 2017-07-24 VITALS — BP 114/72 | HR 92 | Ht 67.0 in | Wt 257.0 lb

## 2017-07-24 DIAGNOSIS — Z3A10 10 weeks gestation of pregnancy: Secondary | ICD-10-CM | POA: Diagnosis not present

## 2017-07-24 DIAGNOSIS — O0992 Supervision of high risk pregnancy, unspecified, second trimester: Secondary | ICD-10-CM | POA: Insufficient documentation

## 2017-07-24 DIAGNOSIS — O99211 Obesity complicating pregnancy, first trimester: Secondary | ICD-10-CM | POA: Diagnosis not present

## 2017-07-24 DIAGNOSIS — O0990 Supervision of high risk pregnancy, unspecified, unspecified trimester: Secondary | ICD-10-CM

## 2017-07-24 DIAGNOSIS — O09291 Supervision of pregnancy with other poor reproductive or obstetric history, first trimester: Secondary | ICD-10-CM

## 2017-07-24 DIAGNOSIS — Z01419 Encounter for gynecological examination (general) (routine) without abnormal findings: Secondary | ICD-10-CM | POA: Diagnosis not present

## 2017-07-24 DIAGNOSIS — O219 Vomiting of pregnancy, unspecified: Secondary | ICD-10-CM

## 2017-07-24 DIAGNOSIS — O99210 Obesity complicating pregnancy, unspecified trimester: Secondary | ICD-10-CM

## 2017-07-24 DIAGNOSIS — O09299 Supervision of pregnancy with other poor reproductive or obstetric history, unspecified trimester: Secondary | ICD-10-CM

## 2017-07-24 DIAGNOSIS — Z113 Encounter for screening for infections with a predominantly sexual mode of transmission: Secondary | ICD-10-CM | POA: Insufficient documentation

## 2017-07-24 DIAGNOSIS — Z3A26 26 weeks gestation of pregnancy: Secondary | ICD-10-CM | POA: Diagnosis not present

## 2017-07-24 DIAGNOSIS — Z1379 Encounter for other screening for genetic and chromosomal anomalies: Secondary | ICD-10-CM

## 2017-07-24 MED ORDER — FOLIC ACID 1 MG PO TABS: 1.0000 mg | ORAL_TABLET | Freq: Every day | ORAL | 10 refills | Status: DC

## 2017-07-24 MED ORDER — ONDANSETRON 4 MG PO TBDP: 4.0000 mg | ORAL_TABLET | Freq: Three times a day (TID) | ORAL | 1 refills | Status: DC | PRN

## 2017-07-24 NOTE — Progress Notes (Signed)
New Obstetric Patient H&P    Chief Complaint: "Desires prenatal care"   History of Present Illness: Patient is a 27 y.o. G89P1021 female, who presents to initiate prenatal care.   Based on a 7wk3d ultrasound, her EDD is 02/18/18 and her EGA is [redacted]w[redacted]d. She was seen in the ER 6/8 for nausea/ vomiting and diarrhea and had a positive pregnancy test at that.time. She returned  to the ER for similar complaints 6/19 and had an ultrasound at that time dating pregnancy. She never had a menses after her miscarriage 04/27/2017.Her last pap smear was 2 or 3 months ago (05/11/2017)  and was NIL.    Since June 2019 she claims she has experienced nausea and vomiting, ptyalism, and fatigue. She denies vaginal bleeding. She has tried numerous medications for nausea and vomiting, like Zofran, Bonjesta, and phenergan, but she states she can not keep them down. Can only eat small amounts before she feels full Her past medical history is remarkable for obesity. Her prior pregnancies are notable for a term SVD in 2014 delivering a 8#11 oz female infant over a ML episiotomy and complicated by a 90 sec shoulder dystocia. She has also had SABs in 2016 and 2019  Since her LMP, she admits to the use of tobacco products  No Since her positive pregnancy test, she has not had any alcohol. She denies use of illicit street drugs or past history of addiction. She has gained   7 pounds since the start of her pregnancy. Weighed 250 on 6/8. There are cats in the home:  no If yes NA She admits close contact with children on a regular basis  yes  She has had chicken pox in the past yes She has had Tuberculosis exposures, symptoms, or previously tested positive for TB   no Current or past history of domestic violence. no  Genetic Screening/Teratology Counseling: (Includes patient, baby's father, or anyone in either family with:)   1. Patient's age >/= 75 at [facility]  no 2. Thalassemia ([place], [place], Mediterranean, or Asian  background): MCV<80  no 3. Neural tube defect (meningomyelocele, spina bifida, anencephaly)  no 4. Congenital heart defect  no  5. Down syndrome  no 6. Tay-Sachs (Jewish, [place]  no 7. Canavan's Disease  no 8. Sickle cell disease or trait (African)  no  9. Hemophilia or other blood disorders  no  10. Muscular dystrophy  no  11. Cystic fibrosis  no  12. Huntington's Chorea  no  13. Mental retardation/autism  no 14. Other inherited genetic or chromosomal disorder  no 15. Maternal metabolic disorder (DM, PKU, etc)  no 16. Patient or FOB with a child with a birth defect not listed above no  16a. Patient or FOB with a birth defect themselves no 17. Recurrent pregnancy loss, or stillbirth  She has had 2 miscarriages since her last term pregnancy  6. Any medications since LMP other than prenatal vitamins (include vitamins, supplements, OTC meds, drugs, alcohol)  Yes, Bonjesta, Zofran, phenergan, prenatal vitamins. 19. Any other genetic/environmental exposure to discuss  no  Infection History:   1. Lives with someone with TB or TB exposed  no  2. Patient or partner has history of genital herpes  no 3. Rash or viral illness since LMP  no 4. History of STI (GC, CT, HPV, syphilis, HIV)  Yes, remote history of Chlamydia 5. History of recent travel :  Yes, [place] last month, no symptoms of rash or URI  Other pertinent information:  Yes, Rod, age 43 is also the father of her first child. Twins: FOB's uncles are twins and patient's paternal aunt had twins   Review of Systems: Review of Systems  Constitutional: Positive for malaise/fatigue. Negative for chills, fever and weight loss.  HENT: Negative for congestion, sinus pain and sore throat.   Eyes: Negative for blurred vision and pain.  Respiratory: Negative for hemoptysis, shortness of breath and wheezing.        Breasts: positive for tenderness  Cardiovascular: Negative for chest pain, palpitations and leg swelling.    Gastrointestinal: Positive for nausea and vomiting. Negative for abdominal pain, blood in stool, diarrhea and heartburn.       Positive for excessive saliva and decreased appetite  Genitourinary: Negative for dysuria, frequency, hematuria and urgency.  Musculoskeletal: Negative for back pain, joint pain and myalgias.  Skin: Negative for itching and rash.  Neurological: Positive for tingling. Negative for dizziness and headaches.  Endo/Heme/Allergies: Negative for environmental allergies and polydipsia. Does not bruise/bleed easily.       Negative for hirsutism Positive for hot flashes  Psychiatric/Behavioral: Negative for depression. The patient is not nervous/anxious and does not have insomnia.    Past Medical History:  Past Medical History:  Diagnosis Date  . History of shoulder dystocia in prior pregnancy    with first pregnancy  . Obesity    BMI=40.25 kb/m2    Past Surgical History:  Past Surgical History:  Procedure Laterality Date  . MOUTH SURGERY      Gynecologic History: Patient's last menstrual period was 05/02/2017 (approximate).  Obstetric History: [id number] OB History  Gravida Para Term Preterm AB Living  4 1 1   2 1   SAB TAB Ectopic Multiple Live Births  2       1    # Outcome Date GA Lbr Len/2nd Weight Sex Delivery Anes PTL Lv  4 Current           3 SAB 04/27/17     SAB     2 SAB 2016     SAB     1 Term 11/28/12 [redacted]w[redacted]d  3.941 kg (8 lb 11 oz) M Vag-Spont   LIV     Complications: Shoulder Dystocia   Family History:  Family History  Problem Relation Age of Onset  . Alcohol abuse Mother   . Diabetes Mellitus II Maternal Aunt   . Diabetes Mellitus II Paternal Aunt   . Diabetes Mellitus II Paternal Uncle   . Breast cancer Neg Hx   . Ovarian cancer Neg Hx   . Colon cancer Neg Hx     Social History:  Social History   Socioeconomic History  . Marital status: Significant Other    Spouse name: Rod  . Number of children: 1  . Years of education: Not on  file  . Highest education level: Not on file  Occupational History  . Occupation: Advertising copywriter  Social Needs  . Financial resource strain: Not on file  . Food insecurity:    Worry: Not on file    Inability: Not on file  . Transportation needs:    Medical: Not on file    Non-medical: Not on file  Tobacco Use  . Smoking status: Never Smoker  . Smokeless tobacco: Never Used  Substance and Sexual Activity  . Alcohol use: Yes    Comment: weekends-stopped alcohol with pregnancy  . Drug use: No  . Sexual activity: Yes    Birth control/protection: None  Lifestyle  . Physical activity:    Days per week: Not on file    Minutes per session: Not on file  . Stress: Not on file  Relationships  . Social connections:    Talks on phone: Not on file    Gets together: Not on file    Attends religious service: Not on file    Active member of club or organization: Not on file    Attends meetings of clubs or organizations: Not on file    Relationship status: Not on file  . Intimate partner violence:    Fear of current or ex partner: No    Emotionally abused: No    Physically abused: No    Forced sexual activity: No  Other Topics Concern  . Not on file  Social History Narrative  . Not on file    Allergies:  No Known Allergies  Medications: Prior to Admission medications   Medication Sig Start Date End Date Taking? Authorizing Provider  Prenatal Vit-DSS-Fe Fum-FA (PRENATAL 19) tablet Take 1 tablet by mouth daily. 04/27/17  Yes Emily FilbertWilliams, Jonathan E, MD  Doxylamine-Pyridoxine ER (BONJESTA) 20-20 MG TBCR Take 1 tablet by mouth 2 (two) times daily. Sig 1 tab po  daily at bedtime, 1 tab po daily morning Patient not taking: Reported on 07/24/2017 07/14/17 09/12/17  Natale MilchSchuman, Christanna R, MD  ondansetron (ZOFRAN ODT) 4 MG disintegrating tablet Take 1 tablet (4 mg total) by mouth every 8 (eight) hours as needed for nausea or vomiting. Patient not taking: Reported on 07/24/2017 07/05/17   Rockne MenghiniNorman,  Anne-Caroline, MD  promethazine (PHENERGAN) 25 MG tablet Take 25 mg by mouth every 6 (six) hours as needed. for nausea 06/25/17   [provider]  pyridOXINE (PYRIDOXINE) 25 MG tablet Take 1-2 tablets (25-50 mg total) by mouth every 8 (eight) hours as needed. Patient not taking: Reported on 07/14/2017 07/05/17   Rockne MenghiniNorman, Anne-Caroline, MD    Physical Exam Vitals: BP 114/72   Pulse 92   Wt 116.6 kg (257 lb)   LMP 05/02/2017 (Approximate)   BMI 40.25 kg/m    General: BF in NAD HEENT: normocephalic, anicteric Thyroid: no enlargement, no palpable nodules Breast: soft, no masses, no skin changes Pulmonary: No increased work of breathing, CTAB Cardiovascular: RRR, without murmur Abdomen: obese, soft, non-tender, non-distended.  Umbilicus without lesions.  No hepatomegaly or masses palpable. No evidence of hernia. +FHTs with DT. FH about 3-4FB above the SP. Genitourinary:  External: Normal external female genitalia.  Normal urethral meatus, normal Bartholin's and Skene's  glands.    Vagina: Normal vaginal mucosa, no evidence of prolapse.    Cervix: closed, no bleeding  Uterus: AV, 10 week size, normal contour.   Adnexa: ovaries non-enlarged, no adnexal masses  Rectal: deferred Extremities: no edema, erythema, or tenderness Neurologic: Grossly intact Psychiatric: mood appropriate, affect full   Assessment: 27 y.o. [id number] at 5211w1d presenting to initiate prenatal care [facility] by a 7wk3d ultrasound Obesity in pregnancy with BMI=40 kg/m2 Hx of shoulder dystocia Nausea and vomiting of pregnancy Ptyalism  Plan: 1) Avoid alcoholic beverages. 2) NOB labs and CMP done; needs 1 hour GTT when nausea/vomiting has decreased. 3) Can hold PNV and use folic acid 1 mgm daily. ASA 81 mg -start after 12 weeks 4) RX for Zofran 4 mgm ODT sent to pharmacy  5) Nutrition, food safety (fish, cheese advisories, and high nitrite foods) and exercise discussed. Given handouts on same. 6)  Hospital and practice style discussed with cross coverage  system.  7) Genetic Screening, such as with 1st Trimester Screening, cell free fetal DNA, AFP testing, and Ultrasound, as well as carrier screening, is discussed with patient. At the conclusion of today's visit patient desires genetic testing: first trimester test. To consider testing for CF, SMA, and Fragile X. 8) Patient is asked about travel to areas at risk for the [place] virus, and counseled to avoid travel and exposure to mosquitoes or sexual partners who may have themselves been exposed to the virus. Testing is discussed, and will be ordered as appropriate.  9)RTO in 2 weeks for NT and ROB/ first trimester test and follow up on nausea and vomiting. 10) Discussed high risk status due to obesity-advised will be having growth scans and APTing in the third trimester. Need to discuss having primary Cesarean section due to the history of shoulder dystocia. This was not addressed at this visit (wanted to confirm shoulder dystocia with ROR first), but ROR confirms 90 second shoulder dystocia.  [name], CNM

## 2017-07-26 ENCOUNTER — Encounter: Payer: Self-pay | Admitting: Certified Nurse Midwife

## 2017-07-26 DIAGNOSIS — O0990 Supervision of high risk pregnancy, unspecified, unspecified trimester: Secondary | ICD-10-CM | POA: Insufficient documentation

## 2017-07-26 DIAGNOSIS — O09299 Supervision of pregnancy with other poor reproductive or obstetric history, unspecified trimester: Secondary | ICD-10-CM | POA: Insufficient documentation

## 2017-07-26 DIAGNOSIS — O99210 Obesity complicating pregnancy, unspecified trimester: Secondary | ICD-10-CM | POA: Insufficient documentation

## 2017-07-26 LAB — COMPREHENSIVE METABOLIC PANEL
ALBUMIN: 3.8 g/dL (ref 3.5–5.5)
ALT: 6 IU/L (ref 0–32)
AST: 11 IU/L (ref 0–40)
Albumin/Globulin Ratio: 1 — ABNORMAL LOW (ref 1.2–2.2)
Alkaline Phosphatase: 65 IU/L (ref 39–117)
BUN / CREAT RATIO: 16 (ref 9–23)
BUN: 9 mg/dL (ref 6–20)
Bilirubin Total: 0.3 mg/dL (ref 0.0–1.2)
CALCIUM: 9.4 mg/dL (ref 8.7–10.2)
CO2: 19 mmol/L — ABNORMAL LOW (ref 20–29)
Chloride: 103 mmol/L (ref 96–106)
Creatinine, Ser: 0.58 mg/dL (ref 0.57–1.00)
GFR, EST AFRICAN AMERICAN: 147 mL/min/{1.73_m2} (ref >59)
GFR, EST NON AFRICAN AMERICAN: 128 mL/min/{1.73_m2} (ref >59)
GLUCOSE: 74 mg/dL (ref 65–99)
Globulin, Total: 3.7 g/dL (ref 1.5–4.5)
Potassium: 4.4 mmol/L (ref 3.5–5.2)
Sodium: 137 mmol/L (ref 134–144)
TOTAL PROTEIN: 7.5 g/dL (ref 6.0–8.5)

## 2017-07-26 LAB — RPR+RH+ABO+RUB AB+AB SCR+CB...
ANTIBODY SCREEN: NEGATIVE
HIV Screen 4th Generation wRfx: NONREACTIVE
Hematocrit: 41.2 % (ref 34.0–46.6)
Hemoglobin: 13.8 g/dL (ref 11.1–15.9)
Hepatitis B Surface Ag: NEGATIVE
MCH: 28.3 pg (ref 26.6–33.0)
MCHC: 33.5 g/dL (ref 31.5–35.7)
MCV: 84 fL (ref 79–97)
PLATELETS: 434 10*3/uL (ref 150–450)
RBC: 4.88 x10E6/uL (ref 3.77–5.28)
RDW: 13.5 % (ref 12.3–15.4)
RPR Ser Ql: NONREACTIVE
Rh Factor: POSITIVE
Rubella Antibodies, IGG: 1.2 index (ref >0.99)
VARICELLA: 483 {index} (ref >165)
WBC: 9.2 10*3/uL (ref 3.4–10.8)

## 2017-07-26 LAB — HEMOGLOBINOPATHY EVALUATION
HEMOGLOBIN F QUANTITATION: 0 % (ref 0.0–2.0)
HGB C: 0 %
HGB S: 0 %
HGB VARIANT: 0 %
Hemoglobin A2 Quantitation: 2.5 % (ref 1.8–3.2)
Hgb A: 97.5 % (ref 96.4–98.8)

## 2017-07-26 LAB — URINE CULTURE

## 2017-07-26 LAB — URINE DRUG PANEL 7
Amphetamines, Urine: NEGATIVE ng/mL
BARBITURATE QUANT UR: NEGATIVE ng/mL
BENZODIAZEPINE QUANT UR: NEGATIVE ng/mL
CANNABINOID QUANT UR: NEGATIVE ng/mL
COCAINE (METAB.): NEGATIVE ng/mL
OPIATE QUANT UR: NEGATIVE ng/mL
PCP Quant, Ur: NEGATIVE ng/mL

## 2017-07-27 ENCOUNTER — Encounter (INDEPENDENT_AMBULATORY_CARE_PROVIDER_SITE_OTHER): Payer: Self-pay

## 2017-07-27 LAB — CERVICOVAGINAL ANCILLARY ONLY
Chlamydia: NEGATIVE
NEISSERIA GONORRHEA: NEGATIVE

## 2017-08-07 ENCOUNTER — Ambulatory Visit (INDEPENDENT_AMBULATORY_CARE_PROVIDER_SITE_OTHER): Payer: BLUE CROSS/BLUE SHIELD | Admitting: Obstetrics & Gynecology

## 2017-08-07 ENCOUNTER — Ambulatory Visit (INDEPENDENT_AMBULATORY_CARE_PROVIDER_SITE_OTHER): Payer: BLUE CROSS/BLUE SHIELD

## 2017-08-07 VITALS — BP 130/80 | Wt 256.0 lb

## 2017-08-07 DIAGNOSIS — Z1379 Encounter for other screening for genetic and chromosomal anomalies: Secondary | ICD-10-CM

## 2017-08-07 DIAGNOSIS — R110 Nausea: Secondary | ICD-10-CM

## 2017-08-07 DIAGNOSIS — O99211 Obesity complicating pregnancy, first trimester: Secondary | ICD-10-CM

## 2017-08-07 DIAGNOSIS — O99210 Obesity complicating pregnancy, unspecified trimester: Secondary | ICD-10-CM

## 2017-08-07 DIAGNOSIS — Z3A12 12 weeks gestation of pregnancy: Secondary | ICD-10-CM

## 2017-08-07 DIAGNOSIS — O09291 Supervision of pregnancy with other poor reproductive or obstetric history, first trimester: Secondary | ICD-10-CM

## 2017-08-07 DIAGNOSIS — O0990 Supervision of high risk pregnancy, unspecified, unspecified trimester: Secondary | ICD-10-CM

## 2017-08-07 DIAGNOSIS — O9989 Other specified diseases and conditions complicating pregnancy, childbirth and the puerperium: Secondary | ICD-10-CM

## 2017-08-07 DIAGNOSIS — O09299 Supervision of pregnancy with other poor reproductive or obstetric history, unspecified trimester: Secondary | ICD-10-CM

## 2017-08-07 MED ORDER — PROMETHAZINE HCL 25 MG PO TABS: 25.0000 mg | ORAL_TABLET | Freq: Four times a day (QID) | ORAL | 0 refills | Status: DC | PRN

## 2017-08-07 NOTE — Progress Notes (Signed)
  Subjective  No pain or bleeding.  Still has nausea.  Phenergan helps.  Objective  BP 130/80   Wt 256 lb (116.1 kg)   LMP 05/02/2017 (Approximate)   BMI 40.10 kg/m  General: NAD Pumonary: no increased work of breathing Abdomen: gravid, non-tender Extremities: no edema Psychiatric: mood appropriate, affect full  Assessment  27 y.o. [id number] at 2912w2d by  02/17/2018, by Ultrasound presenting for routine prenatal visit  Plan   Problem List Items Addressed This Visit      Other   Supervision of high risk pregnancy, antepartum   Relevant Orders   Glucose, 1 hour gestational   MaterniT21 PLUS Core+SCA   Obesity in pregnancy   History of shoulder dystocia in prior pregnancy, currently pregnant    Other Visit Diagnoses    [redacted] weeks gestation of pregnancy    -  Primary    Review of ULTRASOUND.    I have personally reviewed images and report of [facility].    Plan of management to be discussed with patient.    Unable to perform Nuchal measurements today    Options of repeating another day vs cfDNA discussed. Prefers to have testing done today.  MaterniTi21.  Discussed prior birth trauma assoc w shoulder dystocia.  Options of vag vs CS delivery discussed w their pros and cons.  At this time pt prefers to plan for vag delivery.  Counseled will monitor for GDM, Macrosomia; as this may change counseling.  Obesity and preg discussed.  To start baby ASA.  Glucola nv (when better able to tolerate drink).   Annamarie MajorPaul Gunda Maqueda, MD, Merlinda FrederickFACOG [facility], [facility] 08/07/2017  11:55 AM

## 2017-08-11 LAB — MATERNIT21 PLUS CORE+SCA
CHROMOSOME 13: NEGATIVE
CHROMOSOME 18: NEGATIVE
Chromosome 21: NEGATIVE
Y CHROMOSOME: NOT DETECTED

## 2017-09-04 ENCOUNTER — Ambulatory Visit (INDEPENDENT_AMBULATORY_CARE_PROVIDER_SITE_OTHER): Payer: BLUE CROSS/BLUE SHIELD | Admitting: Certified Nurse Midwife

## 2017-09-04 ENCOUNTER — Other Ambulatory Visit: Payer: BLUE CROSS/BLUE SHIELD

## 2017-09-04 ENCOUNTER — Other Ambulatory Visit: Payer: Self-pay | Admitting: Obstetrics & Gynecology

## 2017-09-04 VITALS — BP 120/80 | Wt 256.0 lb

## 2017-09-04 DIAGNOSIS — O9989 Other specified diseases and conditions complicating pregnancy, childbirth and the puerperium: Secondary | ICD-10-CM

## 2017-09-04 DIAGNOSIS — R110 Nausea: Secondary | ICD-10-CM

## 2017-09-04 DIAGNOSIS — O0990 Supervision of high risk pregnancy, unspecified, unspecified trimester: Secondary | ICD-10-CM

## 2017-09-04 DIAGNOSIS — Z3A16 16 weeks gestation of pregnancy: Secondary | ICD-10-CM

## 2017-09-04 DIAGNOSIS — Z1379 Encounter for other screening for genetic and chromosomal anomalies: Secondary | ICD-10-CM

## 2017-09-04 DIAGNOSIS — Z363 Encounter for antenatal screening for malformations: Secondary | ICD-10-CM

## 2017-09-04 LAB — POCT URINALYSIS DIPSTICK OB
GLUCOSE, UA: NEGATIVE — AB
POC,PROTEIN,UA: NEGATIVE

## 2017-09-04 NOTE — Progress Notes (Signed)
HROB at 16wk2d: Having one hour GTT today. Still nauseous, but Phenergan helping. No weight loss since last visit. Materniti 21 was diploid XX. Desires MSAFP today Anatomy scan and ROB in 4 weeks.  Eileen Parsons, CNM

## 2017-09-04 NOTE — Progress Notes (Signed)
No concerns.rj 

## 2017-09-05 LAB — GLUCOSE, 1 HOUR GESTATIONAL: GESTATIONAL DIABETES SCREEN: 82 mg/dL (ref 65–139)

## 2017-09-08 LAB — AFP, SERUM, OPEN SPINA BIFIDA
AFP MoM: 0.59
AFP VALUE AFPOSL: 16 ng/mL
Gest. Age on Collection Date: 16.3 weeks
MATERNAL AGE AT EDD: 26.8 a
OSBR Risk 1 IN: 10000
Test Results:: NEGATIVE
Weight: 256 [lb_av]

## 2017-09-13 ENCOUNTER — Encounter: Payer: Self-pay | Admitting: *Deleted

## 2017-09-13 ENCOUNTER — Emergency Department
Admission: EM | Admit: 2017-09-13 | Discharge: 2017-09-13 | Disposition: A | Discharge status: Home / Self Care | Payer: Medicaid Other | Attending: Emergency Medicine | Admitting: Emergency Medicine

## 2017-09-13 ENCOUNTER — Other Ambulatory Visit: Payer: Self-pay

## 2017-09-13 ENCOUNTER — Emergency Department: Payer: Medicaid Other

## 2017-09-13 DIAGNOSIS — Z3A18 18 weeks gestation of pregnancy: Secondary | ICD-10-CM | POA: Diagnosis not present

## 2017-09-13 DIAGNOSIS — Z3492 Encounter for supervision of normal pregnancy, unspecified, second trimester: Secondary | ICD-10-CM

## 2017-09-13 DIAGNOSIS — R102 Pelvic and perineal pain: Secondary | ICD-10-CM | POA: Insufficient documentation

## 2017-09-13 DIAGNOSIS — O9989 Other specified diseases and conditions complicating pregnancy, childbirth and the puerperium: Secondary | ICD-10-CM | POA: Diagnosis present

## 2017-09-13 DIAGNOSIS — Z79899 Other long term (current) drug therapy: Secondary | ICD-10-CM | POA: Diagnosis not present

## 2017-09-13 NOTE — Discharge Instructions (Signed)
Your workup in the Emergency Department today was reassuring.  We did not find any specific abnormalities and your ultrasound showed a normal-appearing fetus at about [redacted] weeks gestation.  We recommend you drink plenty of fluids, take your regular medications and/or any new ones prescribed today, and follow up with the doctor(s) listed in these documents as recommended.  Return to the Emergency Department if you develop new or worsening symptoms that concern you.

## 2017-09-13 NOTE — ED Provider Notes (Signed)
[facility] Emergency Department Provider Note  ____________________________________________   First MD Initiated Contact with Patient 09/13/17 1836     (approximate)  I have reviewed the triage vital signs and the nursing notes.   HISTORY  Chief Complaint Vaginal pressure    HPI Eileen Parsons is a 27 y.o. female G4 P1 Ab 2 (spontaneous) at about [redacted] weeks gestation who presents for evaluation of a sensation of vaginal pressure.  The symptoms started today.  She says that they are mild to moderate and she only feels it when she is walking and needs to urinate.  She has no dysuria no foul-smelling urine no vaginal bleeding.  She denies fever/chills, chest pain, shortness of breath, nausea, vomiting, abdominal pain, abdominal cramping.  She is little bit worried because she has had to miscarriages in the past and has 231 healthy 573-year-old child.  This is a desired pregnancy.  [facility] and her next appointment is in a little over 2 weeks.  Nothing in particular makes her symptoms better or worse and she is asymptomatic at this time.  She says that she had a urinalysis just last week that was normal.  Past Medical History:  Diagnosis Date  . History of shoulder dystocia in prior pregnancy    with first pregnancy  . Obesity    BMI=40.25 kb/m2    Patient Active Problem List   Diagnosis Date Noted  . Supervision of high risk pregnancy, antepartum 07/26/2017  . Obesity in pregnancy 07/26/2017  . History of shoulder dystocia in prior pregnancy, currently pregnant 07/26/2017    Past Surgical History:  Procedure Laterality Date  . MOUTH SURGERY      Prior to Admission medications   Medication Sig Start Date End Date Taking? Authorizing Provider  folic acid (FOLVITE) 1 MG tablet Take 1 tablet (1 mg total) by mouth daily. 07/24/17   Farrel ConnersGutierrez, Colleen, CNM  ondansetron (ZOFRAN ODT) 4 MG disintegrating tablet Take 1 tablet (4 mg  total) by mouth every 8 (eight) hours as needed for nausea or vomiting. 07/24/17   Farrel ConnersGutierrez, Colleen, CNM  Prenatal Vit-DSS-Fe Fum-FA (PRENATAL 19) tablet Take 1 tablet by mouth daily. 04/27/17   Emily FilbertWilliams, Jonathan E, MD  promethazine (PHENERGAN) 25 MG tablet Take 1 tablet (25 mg total) by mouth every 6 (six) hours as needed. for nausea 08/07/17   Nadara MustardHarris, Robert P, MD  pyridOXINE (PYRIDOXINE) 25 MG tablet Take 1-2 tablets (25-50 mg total) by mouth every 8 (eight) hours as needed. Patient not taking: Reported on 07/14/2017 07/05/17   Rockne MenghiniNorman, Anne-Caroline, MD    Allergies Patient has no known allergies.  Family History  Problem Relation Age of Onset  . Alcohol abuse Mother   . Diabetes Mellitus II Maternal Aunt   . Diabetes Mellitus II Paternal Aunt   . Diabetes Mellitus II Paternal Uncle   . Breast cancer Neg Hx   . Ovarian cancer Neg Hx   . Colon cancer Neg Hx     Social History Social History   Tobacco Use  . Smoking status: Never Smoker  . Smokeless tobacco: Never Used  Substance Use Topics  . Alcohol use: Yes    Comment: weekends-stopped alcohol with pregnancy  . Drug use: No    Review of Systems Constitutional: No fever/chills Eyes: No visual changes. ENT: No sore throat. Cardiovascular: Denies chest pain. Respiratory: Denies shortness of breath. Gastrointestinal: No abdominal pain.  No nausea, no vomiting.  No diarrhea.  No constipation. Genitourinary: Sensation of vaginal pressure when needing to urinate and walking.  Negative for dysuria.  No vaginal bleeding. Musculoskeletal: Negative for neck pain.  Negative for back pain. Integumentary: Negative for rash. Neurological: Negative for headaches, focal weakness or numbness.   ____________________________________________   PHYSICAL EXAM:  VITAL SIGNS: ED Triage Vitals  Enc Vitals Group     BP 09/13/17 1757 125/77     Pulse Rate 09/13/17 1757 92     Resp 09/13/17 1757 16     Temp 09/13/17 1757 99.4 F (37.4  C)     Temp Source 09/13/17 1757 Oral     SpO2 09/13/17 1757 100 %     Weight 09/13/17 1752 116.1 kg (256 lb)     Height --      Head Circumference --      Peak Flow --      Pain Score 09/13/17 1752 0     Pain Loc --      Pain Edu? --      Excl. in GC? --     Constitutional: Alert and oriented. Well appearing and in no acute distress. Eyes: Conjunctivae are normal.  Head: Atraumatic. Nose: No congestion/rhinnorhea. Mouth/Throat: Mucous membranes are moist. Neck: No stridor.  No meningeal signs.   Cardiovascular: Normal rate, regular rhythm. Good peripheral circulation. Grossly normal heart sounds. Respiratory: Normal respiratory effort.  No retractions. Lungs CTAB. Gastrointestinal: Soft and nontender. No distention.  GU:   Deferred given that the patient is in second trimester and there is no occasion for the exam Musculoskeletal: No lower extremity tenderness nor edema. No gross deformities of extremities. Neurologic:  Normal speech and language. No gross focal neurologic deficits are appreciated.  Skin:  Skin is warm, dry and intact. No rash noted. Psychiatric: Mood and affect are normal. Speech and behavior are normal.  ____________________________________________   LABS (all labs ordered are listed, but only abnormal results are displayed)  Labs Reviewed - No data to display ____________________________________________  EKG  None - EKG not ordered by ED physician ____________________________________________  RADIOLOGY   ED MD interpretation: 18 weeks single intrauterine gestation, no emergent abnormalities identified  Official radiology report(s): US Ob Limited  Result Date: 09/13/2017 CLINICAL DATA:  Vaginal and pelvic pressure. EXAM: LIMITED OBSTETRIC ULTRASOUND FINDINGS: Number of Fetuses: 1 Heart Rate:  155 bpm Movement: Visualized Presentation: Breech Placental Location: Posterior Previa: No Amniotic Fluid (Subjective):  Within normal limits. AFI:  cm BPD:  4.0 cm 18 w  0 d MATERNAL FINDINGS: Cervix:  Closed, 3.4 cm Uterus/Adnexae: No abnormality visualized. IMPRESSION: Single intrauterine gestation, 18 weeks by BPD. Fetal heart rate 155 beats per minute. No acute maternal findings. This exam is performed on an emergent basis and does not comprehensively evaluate fetal size, dating, or anatomy; follow-up complete OB US should be considered if further fetal assessment is warranted. COMPARISON:  07/04/2017 Electronically Signed   By: Charlett Nose M.D.   On: 09/13/2017 19:15    ____________________________________________   PROCEDURES  Critical Care performed: No   Procedure(s) performed:   Procedures   ____________________________________________   INITIAL IMPRESSION / ASSESSMENT AND PLAN / ED COURSE  As part of my medical decision making, I reviewed the following data within the electronic MEDICAL RECORD NUMBER Nursing notes reviewed and incorporated, Old chart reviewed and Notes from prior ED visits    Differential diagnosis includes, but is not limited to, normal discomfort and pressure associated with second semester pregnancy, UTI, placenta previa, less likely ectopic  or abruption.  The patient is not having any vaginal bleeding and is not having any pain.  She only feels the pain when she needs to go to the bathroom and is ambulatory.  She has declined a urinalysis at this time since she says she just had one is not having any when she urinates.  She feels reassured after knowing that the ultrasound was within normal limits.  I encouraged outpatient follow-up with her OB/GYN but explained that this is likely positional and due to the fetus growing in size and changing positions in her abdomen.  She understands and agrees with the plan for outpatient follow-up.  I gave my usual and customary return precautions.     ____________________________________________  FINAL CLINICAL IMPRESSION(S) / ED DIAGNOSES  Final diagnoses:  Second  trimester pregnancy     MEDICATIONS GIVEN DURING THIS VISIT:  Medications - No data to display   ED Discharge Orders    None       Note:  This document was prepared using Dragon voice recognition software and may include unintentional dictation errors.    Loleta Rose, MD 09/13/17 [phone]

## 2017-09-13 NOTE — ED Triage Notes (Signed)
PT states she feels pressure when she walks and is concerned because she is [redacted] weeks pregnant.

## 2017-09-13 NOTE — ED Notes (Signed)
PT denies vaginal discharge or bleeding. Pt reporting pressure when ambulating but no pain. No weakness or changes in pregnancy.

## 2017-10-02 ENCOUNTER — Ambulatory Visit (INDEPENDENT_AMBULATORY_CARE_PROVIDER_SITE_OTHER): Payer: BLUE CROSS/BLUE SHIELD | Admitting: Obstetrics and Gynecology

## 2017-10-02 ENCOUNTER — Ambulatory Visit (INDEPENDENT_AMBULATORY_CARE_PROVIDER_SITE_OTHER): Payer: BLUE CROSS/BLUE SHIELD

## 2017-10-02 VITALS — BP 112/72 | Wt 252.0 lb

## 2017-10-02 DIAGNOSIS — Z363 Encounter for antenatal screening for malformations: Secondary | ICD-10-CM | POA: Diagnosis not present

## 2017-10-02 DIAGNOSIS — O99210 Obesity complicating pregnancy, unspecified trimester: Secondary | ICD-10-CM

## 2017-10-02 DIAGNOSIS — O99212 Obesity complicating pregnancy, second trimester: Secondary | ICD-10-CM

## 2017-10-02 DIAGNOSIS — Z3A20 20 weeks gestation of pregnancy: Secondary | ICD-10-CM

## 2017-10-02 DIAGNOSIS — O09299 Supervision of pregnancy with other poor reproductive or obstetric history, unspecified trimester: Secondary | ICD-10-CM

## 2017-10-02 DIAGNOSIS — O0990 Supervision of high risk pregnancy, unspecified, unspecified trimester: Secondary | ICD-10-CM

## 2017-10-02 DIAGNOSIS — Z3A16 16 weeks gestation of pregnancy: Secondary | ICD-10-CM

## 2017-10-02 DIAGNOSIS — O09292 Supervision of pregnancy with other poor reproductive or obstetric history, second trimester: Secondary | ICD-10-CM

## 2017-10-02 DIAGNOSIS — Z0489 Encounter for examination and observation for other specified reasons: Secondary | ICD-10-CM

## 2017-10-02 DIAGNOSIS — IMO0002 Reserved for concepts with insufficient information to code with codable children: Secondary | ICD-10-CM

## 2017-10-02 LAB — POCT URINALYSIS DIPSTICK OB
GLUCOSE, UA: NEGATIVE
POC,PROTEIN,UA: NEGATIVE

## 2017-10-02 MED ORDER — PROCHLORPERAZINE MALEATE 10 MG PO TABS: 10.0000 mg | ORAL_TABLET | Freq: Four times a day (QID) | ORAL | 3 refills | Status: DC | PRN

## 2017-10-02 NOTE — Progress Notes (Signed)
ROB Anatomy Scan/ It is a girl!! Bad headaches

## 2017-10-02 NOTE — Progress Notes (Signed)
Routine Prenatal Care Visit  Subjective  Eileen Parsons is a 27 y.o. G4P1021 at 6838w1d being seen today for ongoing prenatal care.  She is currently monitored for the following issues for this high-risk pregnancy and has Supervision of high risk pregnancy, antepartum; Obesity in pregnancy; and History of shoulder dystocia in prior pregnancy, currently pregnant on their problem list.  ----------------------------------------------------------------------------------- Patient reports headache.    . Vag. Bleeding: None.  Movement: Present. Denies leaking of fluid.  ----------------------------------------------------------------------------------- The following portions of the patient's history were reviewed and updated as appropriate: allergies, current medications, past family history, past medical history, past social history, past surgical history and problem list. Problem list updated.   Objective  Blood pressure 112/72, weight 252 lb (114.3 kg), last menstrual period 05/02/2017. Pregravid weight 250 lb (113.4 kg) Total Weight Gain 2 lb (0.907 kg)  Body mass index is 39.47 kg/m.  Urinalysis:      Fetal Status: Fetal Heart Rate (bpm): 150   Movement: Present     General:  Alert, oriented and cooperative. Patient is in no acute distress.  Skin: Skin is warm and dry. No rash noted.   Cardiovascular: Normal heart rate noted  Respiratory: Normal respiratory effort, no problems with respiration noted  Abdomen: Soft, gravid, appropriate for gestational age. Pain/Pressure: Absent     Pelvic:  Cervical exam deferred        Extremities: Normal range of motion.     ental Status: Normal mood and affect. Normal behavior. Normal judgment and thought content.   [place] Ob Comp + 14 Wk  Result Date: 10/02/2017 Patient Name: Eileen Parsons DOB: [birth year] MRN: [id number] ULTRASOUND REPORT Location: [facility] Date of Service: 10/02/2017 Indications:Anatomy Ultrasound Findings: Mason JimSingleton intrauterine  pregnancy is visualized with FHR at 147 BPM. Biometrics give an (U/S) Gestational age of 5729w6d and an (U/S) EDD of 02/13/18; this correlates with the clinically established Estimated Date of Delivery: 02/18/18 Fetal presentation is transverse. , fetal head on maternal LT EFW: 14oz. Placenta: posterior. Grade: 1 AFI: subjectively normal. Anatomic survey is incomplete for cardiac views and profile d/t fetal position  and normal; Gender - female.  Right Ovary is normal in appearance. Left Ovary is normal appearance. Survey of the adnexa demonstrates no adnexal masses. There is no free peritoneal fluid in the cul de sac. Impression: 1. 8238w1d Viable Singleton Intrauterine pregnancy by U/S. 2. (U/S) EDD is consistent with Clinically established Estimated Date of Delivery: 02/18/18 . 3. Normal Anatomy Scan, incomplete for cardiac views and profile d/t fetal position Recommendations: 1.Clinical correlation with the patient's History and Physical Exam. [name], RDMS  There is a singleton gestation with subjectively normal amniotic fluid volume. The fetal biometry correlates with established dating. Detailed evaluation of the fetal anatomy was performed.The fetal anatomical survey appears within normal limits within the resolution of ultrasound as described above.  Follow up views recommended in 4 weeks.  It must be noted that a normal ultrasound is unable to rule out fetal aneuploidy.  [name], MD, Merlinda FrederickFACOG [facility], [facility] 10/02/2017, 11:26 AM   [place] Ob Limited  Result Date: 09/13/2017 CLINICAL DATA:  Vaginal and pelvic pressure. EXAM: LIMITED OBSTETRIC ULTRASOUND FINDINGS: Number of Fetuses: 1 Heart Rate:  155 bpm Movement: Visualized Presentation: Breech Placental Location: Posterior Previa: No Amniotic Fluid (Subjective):  Within normal limits. AFI:  cm BPD: 4.0 cm 18 w  0 d MATERNAL FINDINGS: Cervix:  Closed, 3.4 cm Uterus/Adnexae: No abnormality visualized. IMPRESSION: Single  intrauterine  gestation, 18 weeks by BPD. Fetal heart rate 155 beats per minute. No acute maternal findings. This exam is performed on an emergent basis and does not comprehensively evaluate fetal size, dating, or anatomy; follow-up complete OB US should be considered if further fetal assessment is warranted. COMPARISON:  07/04/2017 Electronically Signed   By: Charlett Nose M.D.   On: 09/13/2017 19:15     Assessment   27 y.o. [id number] at [redacted]w[redacted]d by  02/18/2018, Alternate EDD Entry presenting for routine prenatal visit  Plan   Pregnancy#4 Problems (from 07/24/17 to present)    Problem Noted Resolved   History of shoulder dystocia in prior pregnancy, currently pregnant 07/26/2017 by [name], CNM No   Overview Addendum 10/02/2017 11:03 AM by Vena Austria, MD    Review of [facility] records: 90 sec shoulder dystocia/ SVD Discussed risks of shoulder dystocia and offer CS for delivery Pelvis tested to 8lbs 11oz [ ]  36 week growth           Gestational age appropriate obstetric precautions including but not limited to vaginal bleeding, contractions, leaking of fluid and fetal movement were reviewed in detail with the patient.    1) Start low dose ASA at >[redacted] weeks gestation as per USPTF recommendation "Low-Dose Aspirin Use for the Prevention of Morbidity and Mortality From Preeclampsia: Preventive Medicine"  furthermore endorsed by ACOG, WHO, and NIH based on evidence level B for the prevention of preeclampsia  In women deemed high risk  (diabetes, renal disease, chronic hypertension, history of preeclampsia in prior gestation, autoimmune diseases, or multifetal gestations)  2) Discussed benefits of influenza vaccination during pregnancy.  The CDC recommends influenza vaccination for all pregnant patient regardless of trimester.  Besides the benefits of preventing influenza in the mother we discussed transfer of maternal antibodies to fetus as well as secretion of antibodies in breast milk which  are protective for the infant.    In case of positive influenza test in the patient or a close household contact, the use of Tamiflu is also recommended by the CDC.   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  If persist neurology referral     Return in about 4 weeks (around 10/30/2017) for ROB and follow up anatomy scan.  Vena Austria, MD, Evern Core [facility], [facility] 10/02/2017, 11:32 AM

## 2017-10-02 NOTE — Patient Instructions (Addendum)
Start low dose ASA at >[redacted] weeks gestation as per USPTF recommendation "Low-Dose Aspirin Use for the Prevention of Morbidity and Mortality From Preeclampsia: Preventive Medicine"  furthermore endorsed by ACOG, WHO, and NIH based on evidence level B for the prevention of preeclampsia  In women deemed high risk  (diabetes, renal disease, chronic hypertension, history of preeclampsia in prior gestation, autoimmune diseases, or multifetal gestations).  ACOG Committee Opinion 743 "Low-Dose Asprin Use During Pregnancy" June 25th 2018  High Risk (Start if 1 or more present) History of preeclampsia Multifetal Gestation Chronic HTN Type I or II DM Renal Disease Autoimmune Disease (SLE, Antiphospholipid antibody syndrome)  Moderate Risk (consider starting if more than one present) Nulliparity Obesity (BMI >30) Family history of Preeclampsia (Mother or sister) Socioeconomic characteristics (African American, low socieeconomic status) Age 80 years or older Personal history factors (low birthweight of SGA, previous adverse pregnancy outcome, more than 10 year pregnancy interval)  Discussed benefits of influenza vaccination during pregnancy.  The CDC recommends influenza vaccination for all pregnant patient regardless of trimester.  Besides the benefits of preventing influenza in the mother we discussed transfer of maternal antibodies to fetus as well as secretion of antibodies in breast milk which are protective for the infant.    In case of positive influenza test in the patient or a close household contact, the use of Tamiflu is also recommended by the CDC.    Magnesium supplements available in the headache care isle are safe to take during pregnancy for headaches.

## 2017-10-30 ENCOUNTER — Ambulatory Visit (INDEPENDENT_AMBULATORY_CARE_PROVIDER_SITE_OTHER): Payer: BLUE CROSS/BLUE SHIELD

## 2017-10-30 ENCOUNTER — Encounter: Payer: Self-pay | Admitting: Advanced Practice Midwife

## 2017-10-30 ENCOUNTER — Ambulatory Visit (INDEPENDENT_AMBULATORY_CARE_PROVIDER_SITE_OTHER): Payer: BLUE CROSS/BLUE SHIELD | Admitting: Advanced Practice Midwife

## 2017-10-30 VITALS — BP 110/70 | Wt 272.0 lb

## 2017-10-30 DIAGNOSIS — O0990 Supervision of high risk pregnancy, unspecified, unspecified trimester: Secondary | ICD-10-CM

## 2017-10-30 DIAGNOSIS — Z3A24 24 weeks gestation of pregnancy: Secondary | ICD-10-CM

## 2017-10-30 DIAGNOSIS — Z113 Encounter for screening for infections with a predominantly sexual mode of transmission: Secondary | ICD-10-CM

## 2017-10-30 DIAGNOSIS — Z131 Encounter for screening for diabetes mellitus: Secondary | ICD-10-CM

## 2017-10-30 DIAGNOSIS — O99210 Obesity complicating pregnancy, unspecified trimester: Secondary | ICD-10-CM

## 2017-10-30 DIAGNOSIS — Z362 Encounter for other antenatal screening follow-up: Secondary | ICD-10-CM

## 2017-10-30 DIAGNOSIS — O09292 Supervision of pregnancy with other poor reproductive or obstetric history, second trimester: Secondary | ICD-10-CM

## 2017-10-30 DIAGNOSIS — Z0489 Encounter for examination and observation for other specified reasons: Secondary | ICD-10-CM

## 2017-10-30 DIAGNOSIS — Z130 Encounter for screening for diseases of the blood and blood-forming organs and certain disorders involving the immune mechanism: Secondary | ICD-10-CM

## 2017-10-30 DIAGNOSIS — O09299 Supervision of pregnancy with other poor reproductive or obstetric history, unspecified trimester: Secondary | ICD-10-CM

## 2017-10-30 DIAGNOSIS — IMO0002 Reserved for concepts with insufficient information to code with codable children: Secondary | ICD-10-CM

## 2017-10-30 LAB — POCT URINALYSIS DIPSTICK OB
GLUCOSE, UA: NEGATIVE
POC,PROTEIN,UA: NEGATIVE

## 2017-10-30 NOTE — Progress Notes (Signed)
ROB and anatomy scan today- no concerns

## 2017-10-30 NOTE — Progress Notes (Addendum)
  Routine Prenatal Care Visit  Subjective  Eileen Parsons is a 27 y.o. G4P1021 at [redacted]w[redacted]d being seen today for ongoing prenatal care.  She is currently monitored for the following issues for this high-risk pregnancy and has Supervision of high risk pregnancy, antepartum; Obesity in pregnancy; and History of shoulder dystocia in prior pregnancy, currently pregnant on their problem list.  ----------------------------------------------------------------------------------- Patient reports no complaints.  She requests work note for lifting restriction of no more than 10 pounds. Contractions: Not present. Vag. Bleeding: None.  Movement: Present. Denies leaking of fluid.  ----------------------------------------------------------------------------------- The following portions of the patient's history were reviewed and updated as appropriate: allergies, current medications, past family history, past medical history, past social history, past surgical history and problem list. Problem list updated.   Objective  Blood pressure 110/70, weight 272 lb (123.4 kg), last menstrual period 05/02/2017. Pregravid weight 250 lb (113.4 kg) Total Weight Gain 22 lb (9.979 kg) Urinalysis: Urine Protein Negative  Urine Glucose Negative  Fetal Status: Fetal Heart Rate (bpm): 153   Movement: Present  Presentation: Transverse  Follow up anatomy scan today is now complete for 4 chamber heart, outflow tracts and profile  General:  Alert, oriented and cooperative. Patient is in no acute distress.  Skin: Skin is warm and dry. No rash noted.   Cardiovascular: Normal heart rate noted  Respiratory: Normal respiratory effort, no problems with respiration noted  Abdomen: Soft, gravid, appropriate for gestational age. Pain/Pressure: Absent     Pelvic:  Cervical exam deferred        Extremities: Normal range of motion.  Edema: None  Mental Status: Normal mood and affect. Normal behavior. Normal judgment and thought content.    Assessment   27 y.o. [id number] at [redacted]w[redacted]d by  02/18/2018, Alternate EDD Entry presenting for routine prenatal visit  Plan   Pregnancy#4 Problems (from 07/24/17 to present)    Problem Noted Resolved   History of shoulder dystocia in prior pregnancy, currently pregnant 07/26/2017 by [name], CNM No   Overview Addendum 10/02/2017 11:03 AM by Vena Austria, MD    Review of [facility] records: 90 sec shoulder dystocia/ SVD Discussed risks of shoulder dystocia and offer CS for delivery Pelvis tested to 8lbs 11oz [ ]  36 week growth           Preterm labor symptoms and general obstetric precautions including but not limited to vaginal bleeding, contractions, leaking of fluid and fetal movement were reviewed in detail with the patient.  Please refer to After Visit Summary for other counseling recommendations.   Return in about 4 weeks (around 11/27/2017) for 28 wk labs and rob.  Tresea Mall, CNM 10/30/2017 11:46 AM

## 2017-11-27 ENCOUNTER — Ambulatory Visit (INDEPENDENT_AMBULATORY_CARE_PROVIDER_SITE_OTHER): Payer: BLUE CROSS/BLUE SHIELD | Admitting: Obstetrics & Gynecology

## 2017-11-27 ENCOUNTER — Encounter: Payer: Self-pay | Admitting: Certified Nurse Midwife

## 2017-11-27 ENCOUNTER — Other Ambulatory Visit: Payer: BLUE CROSS/BLUE SHIELD

## 2017-11-27 VITALS — BP 120/80 | Wt 275.0 lb

## 2017-11-27 DIAGNOSIS — O09293 Supervision of pregnancy with other poor reproductive or obstetric history, third trimester: Secondary | ICD-10-CM

## 2017-11-27 DIAGNOSIS — O99210 Obesity complicating pregnancy, unspecified trimester: Secondary | ICD-10-CM

## 2017-11-27 DIAGNOSIS — Z113 Encounter for screening for infections with a predominantly sexual mode of transmission: Secondary | ICD-10-CM

## 2017-11-27 DIAGNOSIS — O0990 Supervision of high risk pregnancy, unspecified, unspecified trimester: Secondary | ICD-10-CM

## 2017-11-27 DIAGNOSIS — O99213 Obesity complicating pregnancy, third trimester: Secondary | ICD-10-CM

## 2017-11-27 DIAGNOSIS — Z130 Encounter for screening for diseases of the blood and blood-forming organs and certain disorders involving the immune mechanism: Secondary | ICD-10-CM

## 2017-11-27 DIAGNOSIS — Z131 Encounter for screening for diabetes mellitus: Secondary | ICD-10-CM

## 2017-11-27 DIAGNOSIS — Z3A28 28 weeks gestation of pregnancy: Secondary | ICD-10-CM

## 2017-11-27 DIAGNOSIS — O09299 Supervision of pregnancy with other poor reproductive or obstetric history, unspecified trimester: Secondary | ICD-10-CM

## 2017-11-27 LAB — POCT URINALYSIS DIPSTICK OB
GLUCOSE, UA: NEGATIVE
POC,PROTEIN,UA: NEGATIVE

## 2017-11-27 NOTE — Progress Notes (Signed)
  Subjective  Fetal Movement? yes Contractions? no Leaking Fluid? no Vaginal Bleeding? no  Objective  BP 120/80   Wt 275 lb (124.7 kg)   LMP 05/02/2017 (Approximate)   BMI 43.07 kg/m  General: NAD Pumonary: no increased work of breathing Abdomen: gravid, non-tender Extremities: no edema Psychiatric: mood appropriate, affect full  Assessment  27 y.o. [id number] at [redacted]w[redacted]d by  02/18/2018, Alternate EDD Entry presenting for routine prenatal visit  Plan   Problem List Items Addressed This Visit      Other   Supervision of high risk pregnancy, antepartum - Primary   Obesity in pregnancy   History of shoulder dystocia in prior pregnancy, currently pregnant    Other Visit Diagnoses    Screening for diabetes mellitus       [redacted] weeks gestation of pregnancy        Glucola today Monitor growth, [place] 36 weeks Options dicussed for delivery, prefers vaginal PNV Obesity RF discussed, BMI 43 today  Annamarie Major, MD, [facility], [facility] 11/27/2017  10:34 AM

## 2017-11-27 NOTE — Patient Instructions (Signed)
Third Trimester of Pregnancy The third trimester is from week 28 through week 40 (months 7 through 9). The third trimester is a time when the unborn baby (fetus) is growing rapidly. At the end of the ninth month, the fetus is about 20 inches in length and weighs 6-10 pounds. Body changes during your third trimester Your body will continue to go through many changes during pregnancy. The changes vary from woman to woman. During the third trimester:  Your weight will continue to increase. You can expect to gain 25-35 pounds (11-16 kg) by the end of the pregnancy.  You may begin to get stretch marks on your hips, abdomen, and breasts.  You may urinate more often because the fetus is moving lower into your pelvis and pressing on your bladder.  You may develop or continue to have heartburn. This is caused by increased hormones that slow down muscles in the digestive tract.  You may develop or continue to have constipation because increased hormones slow digestion and cause the muscles that push waste through your intestines to relax.  You may develop hemorrhoids. These are swollen veins (varicose veins) in the rectum that can itch or be painful.  You may develop swollen, bulging veins (varicose veins) in your legs.  You may have increased body aches in the pelvis, back, or thighs. This is due to weight gain and increased hormones that are relaxing your joints.  You may have changes in your hair. These can include thickening of your hair, rapid growth, and changes in texture. Some women also have hair loss during or after pregnancy, or hair that feels dry or thin. Your hair will most likely return to normal after your baby is born.  Your breasts will continue to grow and they will continue to become tender. A yellow fluid (colostrum) may leak from your breasts. This is the first milk you are producing for your baby.  Your belly button may stick out.  You may notice more swelling in your hands,  face, or ankles.  You may have increased tingling or numbness in your hands, arms, and legs. The skin on your belly may also feel numb.  You may feel short of breath because of your expanding uterus.  You may have more problems sleeping. This can be caused by the size of your belly, increased need to urinate, and an increase in your body's metabolism.  You may notice the fetus "dropping," or moving lower in your abdomen (lightening).  You may have increased vaginal discharge.  You may notice your joints feel loose and you may have pain around your pelvic bone.  What to expect at prenatal visits You will have prenatal exams every 2 weeks until week 36. Then you will have weekly prenatal exams. During a routine prenatal visit:  You will be weighed to make sure you and the baby are growing normally.  Your blood pressure will be taken.  Your abdomen will be measured to track your baby's growth.  The fetal heartbeat will be listened to.  Any test results from the previous visit will be discussed.  You may have a cervical check near your due date to see if your cervix has softened or thinned (effaced).  You will be tested for Group B streptococcus. This happens between 35 and 37 weeks.  Your health care provider may ask you:  What your birth plan is.  How you are feeling.  If you are feeling the baby move.  If you have had   any abnormal symptoms, such as leaking fluid, bleeding, severe headaches, or abdominal cramping.  If you are using any tobacco products, including cigarettes, chewing tobacco, and electronic cigarettes.  If you have any questions.  Other tests or screenings that may be performed during your third trimester include:  Blood tests that check for low iron levels (anemia).  Fetal testing to check the health, activity level, and growth of the fetus. Testing is done if you have certain medical conditions or if there are problems during the  pregnancy.  Nonstress test (NST). This test checks the health of your baby to make sure there are no signs of problems, such as the baby not getting enough oxygen. During this test, a belt is placed around your belly. The baby is made to move, and its heart rate is monitored during movement.  What is false labor? False labor is a condition in which you feel small, irregular tightenings of the muscles in the womb (contractions) that usually go away with rest, changing position, or drinking water. These are called Braxton Hicks contractions. Contractions may last for hours, days, or even weeks before true labor sets in. If contractions come at regular intervals, become more frequent, increase in intensity, or become painful, you should see your health care provider. What are the signs of labor?  Abdominal cramps.  Regular contractions that start at 10 minutes apart and become stronger and more frequent with time.  Contractions that start on the top of the uterus and spread down to the lower abdomen and back.  Increased pelvic pressure and dull back pain.  A watery or bloody mucus discharge that comes from the vagina.  Leaking of amniotic fluid. This is also known as your "water breaking." It could be a slow trickle or a gush. Let your health care provider know if it has a color or strange odor. If you have any of these signs, call your health care provider right away, even if it is before your due date. Follow these instructions at home: Medicines  Follow your health care provider's instructions regarding medicine use. Specific medicines may be either safe or unsafe to take during pregnancy.  Take a prenatal vitamin that contains at least 600 micrograms (mcg) of folic acid.  If you develop constipation, try taking a stool softener if your health care provider approves. Eating and drinking  Eat a balanced diet that includes fresh fruits and vegetables, whole grains, good sources of protein  such as meat, eggs, or tofu, and low-fat dairy. Your health care provider will help you determine the amount of weight gain that is right for you.  Avoid raw meat and uncooked cheese. These carry germs that can cause birth defects in the baby.  If you have low calcium intake from food, talk to your health care provider about whether you should take a daily calcium supplement.  Eat four or five small meals rather than three large meals a day.  Limit foods that are high in fat and processed sugars, such as fried and sweet foods.  To prevent constipation: ? Drink enough fluid to keep your urine clear or pale yellow. ? Eat foods that are high in fiber, such as fresh fruits and vegetables, whole grains, and beans. Activity  Exercise only as directed by your health care provider. Most women can continue their usual exercise routine during pregnancy. Try to exercise for 30 minutes at least 5 days a week. Stop exercising if you experience uterine contractions.  Avoid heavy   lifting.  Do not exercise in extreme heat or humidity, or at high altitudes.  Wear low-heel, comfortable shoes.  Practice good posture.  You may continue to have sex unless your health care provider tells you otherwise. Relieving pain and discomfort  Take frequent breaks and rest with your legs elevated if you have leg cramps or low back pain.  Take warm sitz baths to soothe any pain or discomfort caused by hemorrhoids. Use hemorrhoid cream if your health care provider approves.  Wear a good support bra to prevent discomfort from breast tenderness.  If you develop varicose veins: ? Wear support pantyhose or compression stockings as told by your healthcare provider. ? Elevate your feet for 15 minutes, 3-4 times a day. Prenatal care  Write down your questions. Take them to your prenatal visits.  Keep all your prenatal visits as told by your health care provider. This is important. Safety  Wear your seat belt at  all times when driving.  Make a list of emergency phone numbers, including numbers for family, friends, the hospital, and police and fire departments. General instructions  Avoid cat litter boxes and soil used by cats. These carry germs that can cause birth defects in the baby. If you have a cat, ask someone to clean the litter box for you.  Do not travel far distances unless it is absolutely necessary and only with the approval of your health care provider.  Do not use hot tubs, steam rooms, or saunas.  Do not drink alcohol.  Do not use any products that contain nicotine or tobacco, such as cigarettes and e-cigarettes. If you need help quitting, ask your health care provider.  Do not use any medicinal herbs or unprescribed drugs. These chemicals affect the formation and growth of the baby.  Do not douche or use tampons or scented sanitary pads.  Do not cross your legs for long periods of time.  To prepare for the arrival of your baby: ? Take prenatal classes to understand, practice, and ask questions about labor and delivery. ? Make a trial run to the hospital. ? Visit the hospital and tour the maternity area. ? Arrange for maternity or paternity leave through employers. ? Arrange for family and friends to take care of pets while you are in the hospital. ? Purchase a rear-facing car seat and make sure you know how to install it in your car. ? Pack your hospital bag. ? Prepare the baby's nursery. Make sure to remove all pillows and stuffed animals from the baby's crib to prevent suffocation.  Visit your dentist if you have not gone during your pregnancy. Use a soft toothbrush to brush your teeth and be gentle when you floss. Contact a health care provider if:  You are unsure if you are in labor or if your water has broken.  You become dizzy.  You have mild pelvic cramps, pelvic pressure, or nagging pain in your abdominal area.  You have lower back pain.  You have persistent  nausea, vomiting, or diarrhea.  You have an unusual or bad smelling vaginal discharge.  You have pain when you urinate. Get help right away if:  Your water breaks before 37 weeks.  You have regular contractions less than 5 minutes apart before 37 weeks.  You have a fever.  You are leaking fluid from your vagina.  You have spotting or bleeding from your vagina.  You have severe abdominal pain or cramping.  You have rapid weight loss or weight gain.    You have shortness of breath with chest pain.  You notice sudden or extreme swelling of your face, hands, ankles, feet, or legs.  Your baby makes fewer than 10 movements in 2 hours.  You have severe headaches that do not go away when you take medicine.  You have vision changes. Summary  The third trimester is from week 28 through week 40, months 7 through 9. The third trimester is a time when the unborn baby (fetus) is growing rapidly.  During the third trimester, your discomfort may increase as you and your baby continue to gain weight. You may have abdominal, leg, and back pain, sleeping problems, and an increased need to urinate.  During the third trimester your breasts will keep growing and they will continue to become tender. A yellow fluid (colostrum) may leak from your breasts. This is the first milk you are producing for your baby.  False labor is a condition in which you feel small, irregular tightenings of the muscles in the womb (contractions) that eventually go away. These are called Braxton Hicks contractions. Contractions may last for hours, days, or even weeks before true labor sets in.  Signs of labor can include: abdominal cramps; regular contractions that start at 10 minutes apart and become stronger and more frequent with time; watery or bloody mucus discharge that comes from the vagina; increased pelvic pressure and dull back pain; and leaking of amniotic fluid. This information is not intended to replace advice  given to you by your health care provider. Make sure you discuss any questions you have with your health care provider. Document Released: 12/28/2000 Document Revised: 06/11/2015 Document Reviewed: 03/06/2012 Elsevier Interactive Patient Education  2017 [employer]  

## 2017-11-27 NOTE — Addendum Note (Signed)
Addended by: Cornelius Moras D on: 11/27/2017 10:38 AM   Modules accepted: Orders

## 2017-11-28 LAB — 28 WEEK RH+PANEL
BASOS ABS: 0.1 10*3/uL (ref 0.0–0.2)
BASOS: 1 %
EOS (ABSOLUTE): 0.2 10*3/uL (ref 0.0–0.4)
Eos: 2 %
GESTATIONAL DIABETES SCREEN: 85 mg/dL (ref 65–139)
HEMATOCRIT: 38 % (ref 34.0–46.6)
HIV Screen 4th Generation wRfx: NONREACTIVE
Hemoglobin: 13 g/dL (ref 11.1–15.9)
Immature Grans (Abs): 0.1 10*3/uL (ref 0.0–0.1)
Immature Granulocytes: 1 %
LYMPHS ABS: 2.4 10*3/uL (ref 0.7–3.1)
Lymphs: 25 %
MCH: 28.6 pg (ref 26.6–33.0)
MCHC: 34.2 g/dL (ref 31.5–35.7)
MCV: 84 fL (ref 79–97)
MONOS ABS: 0.6 10*3/uL (ref 0.1–0.9)
Monocytes: 6 %
Neutrophils Absolute: 6.3 10*3/uL (ref 1.4–7.0)
Neutrophils: 65 %
PLATELETS: 384 10*3/uL (ref 150–450)
RBC: 4.54 x10E6/uL (ref 3.77–5.28)
RDW: 13 % (ref 12.3–15.4)
RPR: NONREACTIVE
WBC: 9.6 10*3/uL (ref 3.4–10.8)

## 2017-12-11 ENCOUNTER — Encounter: Payer: Self-pay | Admitting: Maternal Newborn

## 2017-12-11 ENCOUNTER — Encounter: Payer: BLUE CROSS/BLUE SHIELD | Admitting: Maternal Newborn

## 2017-12-11 ENCOUNTER — Ambulatory Visit (INDEPENDENT_AMBULATORY_CARE_PROVIDER_SITE_OTHER): Payer: BLUE CROSS/BLUE SHIELD | Admitting: Maternal Newborn

## 2017-12-11 VITALS — BP 108/66 | Wt 278.0 lb

## 2017-12-11 DIAGNOSIS — O09293 Supervision of pregnancy with other poor reproductive or obstetric history, third trimester: Secondary | ICD-10-CM

## 2017-12-11 DIAGNOSIS — Z3A30 30 weeks gestation of pregnancy: Secondary | ICD-10-CM

## 2017-12-11 DIAGNOSIS — Z23 Encounter for immunization: Secondary | ICD-10-CM | POA: Diagnosis not present

## 2017-12-11 DIAGNOSIS — O0990 Supervision of high risk pregnancy, unspecified, unspecified trimester: Secondary | ICD-10-CM

## 2017-12-11 LAB — POCT URINALYSIS DIPSTICK OB: Glucose, UA: NEGATIVE

## 2017-12-11 NOTE — Progress Notes (Signed)
ROB TDAP given 

## 2017-12-11 NOTE — Progress Notes (Signed)
    Routine Prenatal Care Visit  Subjective  Ashante Barry DienesOwens is a 27 y.o. G4P1021 at 3553w1d being seen today for ongoing prenatal care.  She is currently monitored for the following issues for this high-risk pregnancy and has Supervision of high risk pregnancy, antepartum; Obesity in pregnancy; and History of shoulder dystocia in prior pregnancy, currently pregnant on their problem list.  ----------------------------------------------------------------------------------- Patient reports occasional vulvar irritation. No itching or abnormal discharge.   Contractions: Not present. Vag. Bleeding: None.  Movement: Present. No leaking of fluid.  ----------------------------------------------------------------------------------- The following portions of the patient's history were reviewed and updated as appropriate: allergies, current medications, past family history, past medical history, past social history, past surgical history and problem list. Problem list updated.  Objective  Blood pressure 108/66, weight 278 lb (126.1 kg), last menstrual period 05/02/2017. Pregravid weight 250 lb (113.4 kg) Total Weight Gain 28 lb (12.7 kg) Body mass index is 43.54 kg/m.   Urinalysis: Protein Small, Glucose Negative  Fetal Status: Fetal Heart Rate (bpm): 145 Fundal Height: 30 cm Movement: Present     General:  Alert, oriented and cooperative. Patient is in no acute distress.  Skin: Skin is warm and dry. No rash noted.   Cardiovascular: Normal heart rate noted  Respiratory: Normal respiratory effort, no problems with respiration noted  Abdomen: Soft, gravid, appropriate for gestational age. Pain/Pressure: Absent     Pelvic:  Cervical exam deferred        Extremities: Normal range of motion.  Edema: None  Mental Status: Normal mood and affect. Normal behavior. Normal judgment and thought content.     Assessment   27 y.o. [id number] at 2453w1d, EDD 02/18/2018 Alternate EDD Entry presenting for a routine  prenatal visit.  Plan   Pregnancy#4 Problems (from 07/24/17 to present)    Problem Noted Resolved   History of shoulder dystocia in prior pregnancy, currently pregnant 07/26/2017 by Farrel ConnersGutierrez, Colleen, CNM No   Overview Addendum 10/02/2017 11:03 AM by Vena AustriaStaebler, Andreas, MD    Review of [facility] records: 90 sec shoulder dystocia/ SVD Discussed risks of shoulder dystocia and offer CS for delivery Pelvis tested to 8lbs 11oz [ ]  36 week growth        Discussed trying soap and laundry detergent for sensitive skin to see if it will help with irritation.  TDaP accepted today.  Please refer to After Visit Summary for other counseling recommendations.   Return in about 2 weeks (around 12/25/2017) for ROB.  Marcelyn BruinsJacelyn , CNM 12/11/2017  3:17 PM

## 2017-12-11 NOTE — Patient Instructions (Signed)
Third Trimester of Pregnancy The third trimester is from week 28 through week 40 (months 7 through 9). The third trimester is a time when the unborn baby (fetus) is growing rapidly. At the end of the ninth month, the fetus is about 20 inches in length and weighs 6-10 pounds. Body changes during your third trimester Your body will continue to go through many changes during pregnancy. The changes vary from woman to woman. During the third trimester:  Your weight will continue to increase. You can expect to gain 25-35 pounds (11-16 kg) by the end of the pregnancy.  You may begin to get stretch marks on your hips, abdomen, and breasts.  You may urinate more often because the fetus is moving lower into your pelvis and pressing on your bladder.  You may develop or continue to have heartburn. This is caused by increased hormones that slow down muscles in the digestive tract.  You may develop or continue to have constipation because increased hormones slow digestion and cause the muscles that push waste through your intestines to relax.  You may develop hemorrhoids. These are swollen veins (varicose veins) in the rectum that can itch or be painful.  You may develop swollen, bulging veins (varicose veins) in your legs.  You may have increased body aches in the pelvis, back, or thighs. This is due to weight gain and increased hormones that are relaxing your joints.  You may have changes in your hair. These can include thickening of your hair, rapid growth, and changes in texture. Some women also have hair loss during or after pregnancy, or hair that feels dry or thin. Your hair will most likely return to normal after your baby is born.  Your breasts will continue to grow and they will continue to become tender. A yellow fluid (colostrum) may leak from your breasts. This is the first milk you are producing for your baby.  Your belly button may stick out.  You may notice more swelling in your hands,  face, or ankles.  You may have increased tingling or numbness in your hands, arms, and legs. The skin on your belly may also feel numb.  You may feel short of breath because of your expanding uterus.  You may have more problems sleeping. This can be caused by the size of your belly, increased need to urinate, and an increase in your body's metabolism.  You may notice the fetus "dropping," or moving lower in your abdomen (lightening).  You may have increased vaginal discharge.  You may notice your joints feel loose and you may have pain around your pelvic bone.  What to expect at prenatal visits You will have prenatal exams every 2 weeks until week 36. Then you will have weekly prenatal exams. During a routine prenatal visit:  You will be weighed to make sure you and the baby are growing normally.  Your blood pressure will be taken.  Your abdomen will be measured to track your baby's growth.  The fetal heartbeat will be listened to.  Any test results from the previous visit will be discussed.  You may have a cervical check near your due date to see if your cervix has softened or thinned (effaced).  You will be tested for Group B streptococcus. This happens between 35 and 37 weeks.  Your health care provider may ask you:  What your birth plan is.  How you are feeling.  If you are feeling the baby move.  If you have had   any abnormal symptoms, such as leaking fluid, bleeding, severe headaches, or abdominal cramping.  If you are using any tobacco products, including cigarettes, chewing tobacco, and electronic cigarettes.  If you have any questions.  Other tests or screenings that may be performed during your third trimester include:  Blood tests that check for low iron levels (anemia).  Fetal testing to check the health, activity level, and growth of the fetus. Testing is done if you have certain medical conditions or if there are problems during the  pregnancy.  Nonstress test (NST). This test checks the health of your baby to make sure there are no signs of problems, such as the baby not getting enough oxygen. During this test, a belt is placed around your belly. The baby is made to move, and its heart rate is monitored during movement.  What is false labor? False labor is a condition in which you feel small, irregular tightenings of the muscles in the womb (contractions) that usually go away with rest, changing position, or drinking water. These are called Braxton Hicks contractions. Contractions may last for hours, days, or even weeks before true labor sets in. If contractions come at regular intervals, become more frequent, increase in intensity, or become painful, you should see your health care provider. What are the signs of labor?  Abdominal cramps.  Regular contractions that start at 10 minutes apart and become stronger and more frequent with time.  Contractions that start on the top of the uterus and spread down to the lower abdomen and back.  Increased pelvic pressure and dull back pain.  A watery or bloody mucus discharge that comes from the vagina.  Leaking of amniotic fluid. This is also known as your "water breaking." It could be a slow trickle or a gush. Let your health care provider know if it has a color or strange odor. If you have any of these signs, call your health care provider right away, even if it is before your due date. Follow these instructions at home: Medicines  Follow your health care provider's instructions regarding medicine use. Specific medicines may be either safe or unsafe to take during pregnancy.  Take a prenatal vitamin that contains at least 600 micrograms (mcg) of folic acid.  If you develop constipation, try taking a stool softener if your health care provider approves. Eating and drinking  Eat a balanced diet that includes fresh fruits and vegetables, whole grains, good sources of protein  such as meat, eggs, or tofu, and low-fat dairy. Your health care provider will help you determine the amount of weight gain that is right for you.  Avoid raw meat and uncooked cheese. These carry germs that can cause birth defects in the baby.  If you have low calcium intake from food, talk to your health care provider about whether you should take a daily calcium supplement.  Eat four or five small meals rather than three large meals a day.  Limit foods that are high in fat and processed sugars, such as fried and sweet foods.  To prevent constipation: ? Drink enough fluid to keep your urine clear or pale yellow. ? Eat foods that are high in fiber, such as fresh fruits and vegetables, whole grains, and beans. Activity  Exercise only as directed by your health care provider. Most women can continue their usual exercise routine during pregnancy. Try to exercise for 30 minutes at least 5 days a week. Stop exercising if you experience uterine contractions.  Avoid heavy   lifting.  Do not exercise in extreme heat or humidity, or at high altitudes.  Wear low-heel, comfortable shoes.  Practice good posture.  You may continue to have sex unless your health care provider tells you otherwise. Relieving pain and discomfort  Take frequent breaks and rest with your legs elevated if you have leg cramps or low back pain.  Take warm sitz baths to soothe any pain or discomfort caused by hemorrhoids. Use hemorrhoid cream if your health care provider approves.  Wear a good support bra to prevent discomfort from breast tenderness.  If you develop varicose veins: ? Wear support pantyhose or compression stockings as told by your healthcare provider. ? Elevate your feet for 15 minutes, 3-4 times a day. Prenatal care  Write down your questions. Take them to your prenatal visits.  Keep all your prenatal visits as told by your health care provider. This is important. Safety  Wear your seat belt at  all times when driving.  Make a list of emergency phone numbers, including numbers for family, friends, the hospital, and police and fire departments. General instructions  Avoid cat litter boxes and soil used by cats. These carry germs that can cause birth defects in the baby. If you have a cat, ask someone to clean the litter box for you.  Do not travel far distances unless it is absolutely necessary and only with the approval of your health care provider.  Do not use hot tubs, steam rooms, or saunas.  Do not drink alcohol.  Do not use any products that contain nicotine or tobacco, such as cigarettes and e-cigarettes. If you need help quitting, ask your health care provider.  Do not use any medicinal herbs or unprescribed drugs. These chemicals affect the formation and growth of the baby.  Do not douche or use tampons or scented sanitary pads.  Do not cross your legs for long periods of time.  To prepare for the arrival of your baby: ? Take prenatal classes to understand, practice, and ask questions about labor and delivery. ? Make a trial run to the hospital. ? Visit the hospital and tour the maternity area. ? Arrange for maternity or paternity leave through employers. ? Arrange for family and friends to take care of pets while you are in the hospital. ? Purchase a rear-facing car seat and make sure you know how to install it in your car. ? Pack your hospital bag. ? Prepare the baby's nursery. Make sure to remove all pillows and stuffed animals from the baby's crib to prevent suffocation.  Visit your dentist if you have not gone during your pregnancy. Use a soft toothbrush to brush your teeth and be gentle when you floss. Contact a health care provider if:  You are unsure if you are in labor or if your water has broken.  You become dizzy.  You have mild pelvic cramps, pelvic pressure, or nagging pain in your abdominal area.  You have lower back pain.  You have persistent  nausea, vomiting, or diarrhea.  You have an unusual or bad smelling vaginal discharge.  You have pain when you urinate. Get help right away if:  Your water breaks before 37 weeks.  You have regular contractions less than 5 minutes apart before 37 weeks.  You have a fever.  You are leaking fluid from your vagina.  You have spotting or bleeding from your vagina.  You have severe abdominal pain or cramping.  You have rapid weight loss or weight gain.    You have shortness of breath with chest pain.  You notice sudden or extreme swelling of your face, hands, ankles, feet, or legs.  Your baby makes fewer than 10 movements in 2 hours.  You have severe headaches that do not go away when you take medicine.  You have vision changes. Summary  The third trimester is from week 28 through week 40, months 7 through 9. The third trimester is a time when the unborn baby (fetus) is growing rapidly.  During the third trimester, your discomfort may increase as you and your baby continue to gain weight. You may have abdominal, leg, and back pain, sleeping problems, and an increased need to urinate.  During the third trimester your breasts will keep growing and they will continue to become tender. A yellow fluid (colostrum) may leak from your breasts. This is the first milk you are producing for your baby.  False labor is a condition in which you feel small, irregular tightenings of the muscles in the womb (contractions) that eventually go away. These are called Braxton Hicks contractions. Contractions may last for hours, days, or even weeks before true labor sets in.  Signs of labor can include: abdominal cramps; regular contractions that start at 10 minutes apart and become stronger and more frequent with time; watery or bloody mucus discharge that comes from the vagina; increased pelvic pressure and dull back pain; and leaking of amniotic fluid. This information is not intended to replace advice  given to you by your health care provider. Make sure you discuss any questions you have with your health care provider. Document Released: 12/28/2000 Document Revised: 06/11/2015 Document Reviewed: 03/06/2012 Elsevier Interactive Patient Education  2017 [employer]  

## 2017-12-15 ENCOUNTER — Emergency Department
Admission: EM | Admit: 2017-12-15 | Discharge: 2017-12-15 | Disposition: A | Discharge status: Home / Self Care | Payer: Medicaid Other | Attending: Student in an Organized Health Care Education/Training Program | Admitting: Student in an Organized Health Care Education/Training Program

## 2017-12-15 ENCOUNTER — Other Ambulatory Visit: Payer: Self-pay

## 2017-12-15 DIAGNOSIS — J040 Acute laryngitis: Secondary | ICD-10-CM

## 2017-12-15 DIAGNOSIS — R05 Cough: Secondary | ICD-10-CM | POA: Insufficient documentation

## 2017-12-15 DIAGNOSIS — R070 Pain in throat: Secondary | ICD-10-CM | POA: Diagnosis present

## 2017-12-15 DIAGNOSIS — J069 Acute upper respiratory infection, unspecified: Secondary | ICD-10-CM | POA: Diagnosis not present

## 2017-12-15 DIAGNOSIS — R0981 Nasal congestion: Secondary | ICD-10-CM | POA: Diagnosis not present

## 2017-12-15 DIAGNOSIS — Z79899 Other long term (current) drug therapy: Secondary | ICD-10-CM | POA: Diagnosis not present

## 2017-12-15 LAB — GROUP A STREP BY PCR: Group A Strep by PCR: NOT DETECTED

## 2017-12-15 MED ORDER — AZITHROMYCIN 250 MG PO TABS: ORAL_TABLET | ORAL | 0 refills | Status: DC

## 2017-12-15 NOTE — ED Provider Notes (Signed)
[facility] Emergency Department Provider Note  ____________________________________________  Time seen: Approximately 7:43 AM  I have reviewed the triage vital signs and the nursing notes.   HISTORY  Chief Complaint Sore Throat    HPI Eileen Parsons is a 27 y.o. female presents emergency department for evaluation of nasal congestion, sore throat, hoarse voice, nonproductive cough for 2 days.  No sick contacts.  No fevers.  Patient does not smoke.  Patient is [redacted] weeks pregnant and denies any pregnancy concerns.  No nausea, vomiting, abdominal pain.   Past Medical History:  Diagnosis Date  . History of shoulder dystocia in prior pregnancy    with first pregnancy  . Obesity    BMI=40.25 kb/m2    Patient Active Problem List   Diagnosis Date Noted  . Supervision of high risk pregnancy, antepartum 07/26/2017  . Obesity in pregnancy 07/26/2017  . History of shoulder dystocia in prior pregnancy, currently pregnant 07/26/2017    Past Surgical History:  Procedure Laterality Date  . MOUTH SURGERY      Prior to Admission medications   Medication Sig Start Date End Date Taking? Authorizing Provider  azithromycin (ZITHROMAX Z-PAK) 250 MG tablet Take 2 tablets (500 mg) on  Day 1,  followed by 1 tablet (250 mg) once daily on Days 2 through 5. 12/15/17   Enid DerryWagner, Ying Blankenhorn, PA-C  folic acid (FOLVITE) 1 MG tablet Take 1 tablet (1 mg total) by mouth daily. 07/24/17   Farrel ConnersGutierrez, Colleen, CNM  Prenatal Vit-DSS-Fe Fum-FA (PRENATAL 19) tablet Take 1 tablet by mouth daily. 04/27/17   Emily FilbertWilliams, Jonathan E, MD  prochlorperazine (COMPAZINE) 10 MG tablet Take 1 tablet (10 mg total) by mouth every 6 (six) hours as needed for nausea (headaches). Patient not taking: Reported on 12/11/2017 10/02/17   Vena AustriaStaebler, Andreas, MD    Allergies Patient has no known allergies.  Family History  Problem Relation Age of Onset  . Alcohol abuse Mother   . Diabetes Mellitus II Maternal Aunt   .  Diabetes Mellitus II Paternal Aunt   . Diabetes Mellitus II Paternal Uncle   . Breast cancer Neg Hx   . Ovarian cancer Neg Hx   . Colon cancer Neg Hx     Social History Social History   Tobacco Use  . Smoking status: Never Smoker  . Smokeless tobacco: Never Used  Substance Use Topics  . Alcohol use: Yes    Comment: weekends-stopped alcohol with pregnancy  . Drug use: No     Review of Systems  Constitutional: No fever/chills Eyes: No visual changes. No discharge. ENT: Positive for congestion and rhinorrhea. Cardiovascular: No chest pain. Respiratory: Positive for cough. No SOB. Gastrointestinal: No abdominal pain.  No nausea, no vomiting. Musculoskeletal: Negative for musculoskeletal pain. Skin: Negative for rash, abrasions, lacerations, ecchymosis. Neurological: Negative for headaches.   ____________________________________________   PHYSICAL EXAM:  VITAL SIGNS: ED Triage Vitals  Enc Vitals Group     BP 12/15/17 0725 118/71     Pulse Rate 12/15/17 0725 99     Resp 12/15/17 0725 20     Temp 12/15/17 0725 98.3 F (36.8 C)     Temp Source 12/15/17 0725 Oral     SpO2 12/15/17 0725 97 %     Weight 12/15/17 0725 272 lb (123.4 kg)     Height 12/15/17 0725 5\' 5"  (1.651 m)     Head Circumference --      Peak Flow --      Pain Score 12/15/17 0730  7     Pain Loc --      Pain Edu? --      Excl. in GC? --      Constitutional: Alert and oriented. Well appearing and in no acute distress. Eyes: Conjunctivae are normal. PERRL. EOMI. No discharge. Head: Atraumatic. ENT: No frontal and maxillary sinus tenderness.      Ears: Tympanic membranes pearly gray with good landmarks. No discharge.      Nose: Mild congestion/rhinnorhea.      Mouth/Throat: Mucous membranes are moist. Oropharynx mildly erythematous. Tonsils not enlarged. No exudates. Uvula midline. Neck: No stridor.   Hematological/Lymphatic/Immunilogical: No cervical lymphadenopathy. Cardiovascular: Normal rate,  regular rhythm.  Good peripheral circulation. Respiratory: Normal respiratory effort without tachypnea or retractions. Lungs CTAB. Good air entry to the bases with no decreased or absent breath sounds. Gastrointestinal: Bowel sounds 4 quadrants. Soft and nontender to palpation. No guarding or rigidity. No palpable masses. No distention. Musculoskeletal: Full range of motion to all extremities. No gross deformities appreciated. Neurologic:  Normal speech and language. No gross focal neurologic deficits are appreciated.  Skin:  Skin is warm, dry and intact. No rash noted. Psychiatric: Mood and affect are normal. Speech and behavior are normal. Patient exhibits appropriate insight and judgement.   ____________________________________________   LABS (all labs ordered are listed, but only abnormal results are displayed)  Labs Reviewed  GROUP A STREP BY PCR   ____________________________________________  EKG   ____________________________________________  RADIOLOGY   No results found.  ____________________________________________    PROCEDURES  Procedure(s) performed:    Procedures    Medications - No data to display   ____________________________________________   INITIAL IMPRESSION / ASSESSMENT AND PLAN / ED COURSE  Pertinent labs & imaging results that were available during my care of the patient were reviewed by me and considered in my medical decision making (see chart for details).  Review of the [place] CSRS was performed in accordance of the [facility] prior to dispensing any controlled drugs.     Patient's diagnosis is consistent with URI and laryngitis. Vital signs and exam are reassuring.  Strep test is negative.  Patient appears well and is staying well hydrated. Patient feels comfortable going home.  Patient denies any pregnancy concerns.  Patient will be discharged home with prescriptions for azithromycin. Patient is to follow up with PCP as needed or otherwise  directed. Patient is given ED precautions to return to the ED for any worsening or new symptoms.     ____________________________________________  FINAL CLINICAL IMPRESSION(S) / ED DIAGNOSES  Final diagnoses:  Laryngitis  URI with cough and congestion      NEW MEDICATIONS STARTED DURING THIS VISIT:  ED Discharge Orders         Ordered    azithromycin (ZITHROMAX Z-PAK) 250 MG tablet     12/15/17 0849              This chart was dictated using voice recognition software/Dragon. Despite best efforts to proofread, errors can occur which can change the meaning. Any change was purely unintentional.    Enid Derry, PA-C 12/15/17 1337    Willy Eddy, MD 12/15/17 1515

## 2017-12-15 NOTE — Discharge Instructions (Signed)
Please return to the emergency department immediately for fever, chills, productive cough, shortness of breath or any worsening symptoms.  Please follow-up with primary care or OB/GYN on Monday.

## 2017-12-15 NOTE — ED Triage Notes (Signed)
Sore throat and congestion x 2 days. Hoarse voice. Coughing in triage. Denies fevers. A&O, ambulatory.

## 2017-12-25 ENCOUNTER — Encounter: Payer: Self-pay | Admitting: Maternal Newborn

## 2017-12-25 ENCOUNTER — Ambulatory Visit (INDEPENDENT_AMBULATORY_CARE_PROVIDER_SITE_OTHER): Payer: BLUE CROSS/BLUE SHIELD | Admitting: Maternal Newborn

## 2017-12-25 VITALS — BP 100/70 | Wt 277.0 lb

## 2017-12-25 DIAGNOSIS — N898 Other specified noninflammatory disorders of vagina: Secondary | ICD-10-CM

## 2017-12-25 DIAGNOSIS — O0990 Supervision of high risk pregnancy, unspecified, unspecified trimester: Secondary | ICD-10-CM

## 2017-12-25 DIAGNOSIS — O26893 Other specified pregnancy related conditions, third trimester: Secondary | ICD-10-CM | POA: Diagnosis not present

## 2017-12-25 DIAGNOSIS — Z3A32 32 weeks gestation of pregnancy: Secondary | ICD-10-CM

## 2017-12-25 DIAGNOSIS — B3731 Acute candidiasis of vulva and vagina: Secondary | ICD-10-CM

## 2017-12-25 DIAGNOSIS — B373 Candidiasis of vulva and vagina: Secondary | ICD-10-CM

## 2017-12-25 LAB — POCT WET PREP (WET MOUNT)
Clue Cells Wet Prep Whiff POC: NEGATIVE
Trichomonas Wet Prep HPF POC: ABSENT

## 2017-12-25 LAB — POCT URINALYSIS DIPSTICK OB: Glucose, UA: NEGATIVE

## 2017-12-25 MED ORDER — TERCONAZOLE 0.4 % VA CREA: 1.0000 | TOPICAL_CREAM | Freq: Every day | VAGINAL | 0 refills | Status: AC

## 2017-12-25 NOTE — Progress Notes (Signed)
    Routine Prenatal Care Visit  Subjective  Eileen Parsons is a 27 y.o. G4P1021 at 4464w1d being seen today for ongoing prenatal care.  She is currently monitored for the following issues for this high-risk pregnancy and has Supervision of high risk pregnancy, antepartum; Obesity in pregnancy; and History of shoulder dystocia in prior pregnancy, currently pregnant on their problem list.  ----------------------------------------------------------------------------------- Patient reports ongoing vaginal irritation, concerned that she has a yeast infection.   Contractions: Not present. Vag. Bleeding: None.  Movement: Present. No leaking of fluid.  ----------------------------------------------------------------------------------- The following portions of the patient's history were reviewed and updated as appropriate: allergies, current medications, past family history, past medical history, past social history, past surgical history and problem list. Problem list updated.  Objective  Blood pressure 100/70, weight 277 lb (125.6 kg), last menstrual period 05/02/2017. Pregravid weight 250 lb (113.4 kg) Total Weight Gain 27 lb (12.2 kg) Body mass index is 46.1 kg/m.   Urinalysis: Protein Small, Glucose Negative  Fetal Status: Fetal Heart Rate (bpm): 154 Fundal Height: 33 cm Movement: Present     General:  Alert, oriented and cooperative. Patient is in no acute distress.  Skin: Skin is warm and dry. No rash noted.   Cardiovascular: Normal heart rate noted  Respiratory: Normal respiratory effort, no problems with respiration noted  Abdomen: Soft, gravid, appropriate for gestational age. Pain/Pressure: Present     Pelvic:  Cervical exam deferred        Extremities: Normal range of motion.  Edema: None  Mental Status: Normal mood and affect. Normal behavior. Normal judgment and thought content.     Assessment   27 y.o. [id number] at 7264w1d, EDD 02/18/2018 Alternate EDD Entry presenting for a routine  prenatal visit.  Plan   Pregnancy#4 Problems (from 07/24/17 to present)    Problem Noted Resolved   History of shoulder dystocia in prior pregnancy, currently pregnant 07/26/2017 by Farrel ConnersGutierrez, Colleen, CNM No   Overview Addendum 10/02/2017 11:03 AM by Vena AustriaStaebler, Andreas, MD    Review of [facility] records: 90 sec shoulder dystocia/ SVD Discussed risks of shoulder dystocia and offer CS for delivery Pelvis tested to 8lbs 11oz [ ]  36 week growth        Wet prep positive for yeast. Rx sent for terconazole  Please refer to After Visit Summary for other counseling recommendations.   Return in about 2 weeks (around 01/08/2018) for ROB.  Marcelyn BruinsJacelyn Schmid, CNM 12/26/2017  8:08 AM

## 2017-12-26 NOTE — Patient Instructions (Signed)
Third Trimester of Pregnancy The third trimester is from week 28 through week 40 (months 7 through 9). The third trimester is a time when the unborn baby (fetus) is growing rapidly. At the end of the ninth month, the fetus is about 20 inches in length and weighs 6-10 pounds. Body changes during your third trimester Your body will continue to go through many changes during pregnancy. The changes vary from woman to woman. During the third trimester:  Your weight will continue to increase. You can expect to gain 25-35 pounds (11-16 kg) by the end of the pregnancy.  You may begin to get stretch marks on your hips, abdomen, and breasts.  You may urinate more often because the fetus is moving lower into your pelvis and pressing on your bladder.  You may develop or continue to have heartburn. This is caused by increased hormones that slow down muscles in the digestive tract.  You may develop or continue to have constipation because increased hormones slow digestion and cause the muscles that push waste through your intestines to relax.  You may develop hemorrhoids. These are swollen veins (varicose veins) in the rectum that can itch or be painful.  You may develop swollen, bulging veins (varicose veins) in your legs.  You may have increased body aches in the pelvis, back, or thighs. This is due to weight gain and increased hormones that are relaxing your joints.  You may have changes in your hair. These can include thickening of your hair, rapid growth, and changes in texture. Some women also have hair loss during or after pregnancy, or hair that feels dry or thin. Your hair will most likely return to normal after your baby is born.  Your breasts will continue to grow and they will continue to become tender. A yellow fluid (colostrum) may leak from your breasts. This is the first milk you are producing for your baby.  Your belly button may stick out.  You may notice more swelling in your hands,  face, or ankles.  You may have increased tingling or numbness in your hands, arms, and legs. The skin on your belly may also feel numb.  You may feel short of breath because of your expanding uterus.  You may have more problems sleeping. This can be caused by the size of your belly, increased need to urinate, and an increase in your body's metabolism.  You may notice the fetus "dropping," or moving lower in your abdomen (lightening).  You may have increased vaginal discharge.  You may notice your joints feel loose and you may have pain around your pelvic bone.  What to expect at prenatal visits You will have prenatal exams every 2 weeks until week 36. Then you will have weekly prenatal exams. During a routine prenatal visit:  You will be weighed to make sure you and the baby are growing normally.  Your blood pressure will be taken.  Your abdomen will be measured to track your baby's growth.  The fetal heartbeat will be listened to.  Any test results from the previous visit will be discussed.  You may have a cervical check near your due date to see if your cervix has softened or thinned (effaced).  You will be tested for Group B streptococcus. This happens between 35 and 37 weeks.  Your health care provider may ask you:  What your birth plan is.  How you are feeling.  If you are feeling the baby move.  If you have had   any abnormal symptoms, such as leaking fluid, bleeding, severe headaches, or abdominal cramping.  If you are using any tobacco products, including cigarettes, chewing tobacco, and electronic cigarettes.  If you have any questions.  Other tests or screenings that may be performed during your third trimester include:  Blood tests that check for low iron levels (anemia).  Fetal testing to check the health, activity level, and growth of the fetus. Testing is done if you have certain medical conditions or if there are problems during the  pregnancy.  Nonstress test (NST). This test checks the health of your baby to make sure there are no signs of problems, such as the baby not getting enough oxygen. During this test, a belt is placed around your belly. The baby is made to move, and its heart rate is monitored during movement.  What is false labor? False labor is a condition in which you feel small, irregular tightenings of the muscles in the womb (contractions) that usually go away with rest, changing position, or drinking water. These are called Braxton Hicks contractions. Contractions may last for hours, days, or even weeks before true labor sets in. If contractions come at regular intervals, become more frequent, increase in intensity, or become painful, you should see your health care provider. What are the signs of labor?  Abdominal cramps.  Regular contractions that start at 10 minutes apart and become stronger and more frequent with time.  Contractions that start on the top of the uterus and spread down to the lower abdomen and back.  Increased pelvic pressure and dull back pain.  A watery or bloody mucus discharge that comes from the vagina.  Leaking of amniotic fluid. This is also known as your "water breaking." It could be a slow trickle or a gush. Let your health care provider know if it has a color or strange odor. If you have any of these signs, call your health care provider right away, even if it is before your due date. Follow these instructions at home: Medicines  Follow your health care provider's instructions regarding medicine use. Specific medicines may be either safe or unsafe to take during pregnancy.  Take a prenatal vitamin that contains at least 600 micrograms (mcg) of folic acid.  If you develop constipation, try taking a stool softener if your health care provider approves. Eating and drinking  Eat a balanced diet that includes fresh fruits and vegetables, whole grains, good sources of protein  such as meat, eggs, or tofu, and low-fat dairy. Your health care provider will help you determine the amount of weight gain that is right for you.  Avoid raw meat and uncooked cheese. These carry germs that can cause birth defects in the baby.  If you have low calcium intake from food, talk to your health care provider about whether you should take a daily calcium supplement.  Eat four or five small meals rather than three large meals a day.  Limit foods that are high in fat and processed sugars, such as fried and sweet foods.  To prevent constipation: ? Drink enough fluid to keep your urine clear or pale yellow. ? Eat foods that are high in fiber, such as fresh fruits and vegetables, whole grains, and beans. Activity  Exercise only as directed by your health care provider. Most women can continue their usual exercise routine during pregnancy. Try to exercise for 30 minutes at least 5 days a week. Stop exercising if you experience uterine contractions.  Avoid heavy   lifting.  Do not exercise in extreme heat or humidity, or at high altitudes.  Wear low-heel, comfortable shoes.  Practice good posture.  You may continue to have sex unless your health care provider tells you otherwise. Relieving pain and discomfort  Take frequent breaks and rest with your legs elevated if you have leg cramps or low back pain.  Take warm sitz baths to soothe any pain or discomfort caused by hemorrhoids. Use hemorrhoid cream if your health care provider approves.  Wear a good support bra to prevent discomfort from breast tenderness.  If you develop varicose veins: ? Wear support pantyhose or compression stockings as told by your healthcare provider. ? Elevate your feet for 15 minutes, 3-4 times a day. Prenatal care  Write down your questions. Take them to your prenatal visits.  Keep all your prenatal visits as told by your health care provider. This is important. Safety  Wear your seat belt at  all times when driving.  Make a list of emergency phone numbers, including numbers for family, friends, the hospital, and police and fire departments. General instructions  Avoid cat litter boxes and soil used by cats. These carry germs that can cause birth defects in the baby. If you have a cat, ask someone to clean the litter box for you.  Do not travel far distances unless it is absolutely necessary and only with the approval of your health care provider.  Do not use hot tubs, steam rooms, or saunas.  Do not drink alcohol.  Do not use any products that contain nicotine or tobacco, such as cigarettes and e-cigarettes. If you need help quitting, ask your health care provider.  Do not use any medicinal herbs or unprescribed drugs. These chemicals affect the formation and growth of the baby.  Do not douche or use tampons or scented sanitary pads.  Do not cross your legs for long periods of time.  To prepare for the arrival of your baby: ? Take prenatal classes to understand, practice, and ask questions about labor and delivery. ? Make a trial run to the hospital. ? Visit the hospital and tour the maternity area. ? Arrange for maternity or paternity leave through employers. ? Arrange for family and friends to take care of pets while you are in the hospital. ? Purchase a rear-facing car seat and make sure you know how to install it in your car. ? Pack your hospital bag. ? Prepare the baby's nursery. Make sure to remove all pillows and stuffed animals from the baby's crib to prevent suffocation.  Visit your dentist if you have not gone during your pregnancy. Use a soft toothbrush to brush your teeth and be gentle when you floss. Contact a health care provider if:  You are unsure if you are in labor or if your water has broken.  You become dizzy.  You have mild pelvic cramps, pelvic pressure, or nagging pain in your abdominal area.  You have lower back pain.  You have persistent  nausea, vomiting, or diarrhea.  You have an unusual or bad smelling vaginal discharge.  You have pain when you urinate. Get help right away if:  Your water breaks before 37 weeks.  You have regular contractions less than 5 minutes apart before 37 weeks.  You have a fever.  You are leaking fluid from your vagina.  You have spotting or bleeding from your vagina.  You have severe abdominal pain or cramping.  You have rapid weight loss or weight gain.    You have shortness of breath with chest pain.  You notice sudden or extreme swelling of your face, hands, ankles, feet, or legs.  Your baby makes fewer than 10 movements in 2 hours.  You have severe headaches that do not go away when you take medicine.  You have vision changes. Summary  The third trimester is from week 28 through week 40, months 7 through 9. The third trimester is a time when the unborn baby (fetus) is growing rapidly.  During the third trimester, your discomfort may increase as you and your baby continue to gain weight. You may have abdominal, leg, and back pain, sleeping problems, and an increased need to urinate.  During the third trimester your breasts will keep growing and they will continue to become tender. A yellow fluid (colostrum) may leak from your breasts. This is the first milk you are producing for your baby.  False labor is a condition in which you feel small, irregular tightenings of the muscles in the womb (contractions) that eventually go away. These are called Braxton Hicks contractions. Contractions may last for hours, days, or even weeks before true labor sets in.  Signs of labor can include: abdominal cramps; regular contractions that start at 10 minutes apart and become stronger and more frequent with time; watery or bloody mucus discharge that comes from the vagina; increased pelvic pressure and dull back pain; and leaking of amniotic fluid. This information is not intended to replace advice  given to you by your health care provider. Make sure you discuss any questions you have with your health care provider. Document Released: 12/28/2000 Document Revised: 06/11/2015 Document Reviewed: 03/06/2012 Elsevier Interactive Patient Education  2017 [employer]  

## 2018-01-08 ENCOUNTER — Ambulatory Visit (INDEPENDENT_AMBULATORY_CARE_PROVIDER_SITE_OTHER): Payer: BLUE CROSS/BLUE SHIELD | Admitting: Certified Nurse Midwife

## 2018-01-08 VITALS — BP 130/80 | Wt 276.0 lb

## 2018-01-08 DIAGNOSIS — Z3A34 34 weeks gestation of pregnancy: Secondary | ICD-10-CM

## 2018-01-08 DIAGNOSIS — O99213 Obesity complicating pregnancy, third trimester: Secondary | ICD-10-CM

## 2018-01-08 DIAGNOSIS — O0990 Supervision of high risk pregnancy, unspecified, unspecified trimester: Secondary | ICD-10-CM

## 2018-01-08 DIAGNOSIS — O09299 Supervision of pregnancy with other poor reproductive or obstetric history, unspecified trimester: Secondary | ICD-10-CM

## 2018-01-08 DIAGNOSIS — O99210 Obesity complicating pregnancy, unspecified trimester: Secondary | ICD-10-CM

## 2018-01-08 DIAGNOSIS — O09293 Supervision of pregnancy with other poor reproductive or obstetric history, third trimester: Secondary | ICD-10-CM

## 2018-01-08 LAB — POCT URINALYSIS DIPSTICK OB: Glucose, UA: NEGATIVE

## 2018-01-08 NOTE — Progress Notes (Addendum)
HROB at 34wk1d: Hx of shoulder dystocia with prior pregnancy and obesity with current BMI 45.93 kg/m2 Good FM. Some pelvic pain/ soreness Urine dipstick +1 but amber urine. 130/80 Breast/ bottle feeding/ Eileen EnsignBaby [name] Discussed contraceptive options postpartum Will get growth scan in 2 weeks  And discuss delivery method Start weekly NSTs in 2 weeks Consider anesthesia consult with Dr Priscella MannPenwarden after next visit.  Farrel Connersolleen Clearnce Leja, CNM

## 2018-01-08 NOTE — Progress Notes (Signed)
ROB No concerns 

## 2018-01-12 ENCOUNTER — Telehealth: Payer: Self-pay

## 2018-01-12 NOTE — Telephone Encounter (Signed)
Spoke w/pt. 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 She is required to a lot of bending & lifting & has become physically unable to perform these tasks. She is requesting to start her maternity leave now and needs a note for her employer.

## 2018-01-12 NOTE — Telephone Encounter (Signed)
I will defer to you, as I don't know what you and she may have discussed at her last appointment.

## 2018-01-12 NOTE — Telephone Encounter (Signed)
Pt would like to discuss taking her Maternity leave early. [phone]

## 2018-01-15 ENCOUNTER — Encounter: Payer: Self-pay | Admitting: Certified Nurse Midwife

## 2018-01-15 NOTE — Telephone Encounter (Signed)
My chart message to be sent.

## 2018-01-15 NOTE — Telephone Encounter (Signed)
Pt calling again.  [phone] Please advise.

## 2018-01-22 ENCOUNTER — Other Ambulatory Visit (HOSPITAL_COMMUNITY)
Admission: RE | Admit: 2018-01-22 | Discharge: 2018-01-22 | Disposition: A | Discharge status: Home / Self Care | Payer: BLUE CROSS/BLUE SHIELD | Source: Ambulatory Visit | Attending: Maternal Newborn | Admitting: Maternal Newborn

## 2018-01-22 ENCOUNTER — Encounter: Payer: Self-pay | Admitting: Maternal Newborn

## 2018-01-22 ENCOUNTER — Ambulatory Visit (INDEPENDENT_AMBULATORY_CARE_PROVIDER_SITE_OTHER): Payer: BLUE CROSS/BLUE SHIELD | Admitting: Maternal Newborn

## 2018-01-22 ENCOUNTER — Ambulatory Visit (INDEPENDENT_AMBULATORY_CARE_PROVIDER_SITE_OTHER): Payer: BLUE CROSS/BLUE SHIELD

## 2018-01-22 VITALS — BP 120/80 | Wt 276.0 lb

## 2018-01-22 DIAGNOSIS — O09293 Supervision of pregnancy with other poor reproductive or obstetric history, third trimester: Secondary | ICD-10-CM

## 2018-01-22 DIAGNOSIS — O99210 Obesity complicating pregnancy, unspecified trimester: Secondary | ICD-10-CM

## 2018-01-22 DIAGNOSIS — O09299 Supervision of pregnancy with other poor reproductive or obstetric history, unspecified trimester: Secondary | ICD-10-CM

## 2018-01-22 DIAGNOSIS — Z113 Encounter for screening for infections with a predominantly sexual mode of transmission: Secondary | ICD-10-CM | POA: Diagnosis present

## 2018-01-22 DIAGNOSIS — Z369 Encounter for antenatal screening, unspecified: Secondary | ICD-10-CM

## 2018-01-22 DIAGNOSIS — Z3A36 36 weeks gestation of pregnancy: Secondary | ICD-10-CM

## 2018-01-22 DIAGNOSIS — O99213 Obesity complicating pregnancy, third trimester: Secondary | ICD-10-CM | POA: Diagnosis not present

## 2018-01-22 DIAGNOSIS — O0990 Supervision of high risk pregnancy, unspecified, unspecified trimester: Secondary | ICD-10-CM

## 2018-01-22 LAB — POCT URINALYSIS DIPSTICK OB: POC,PROTEIN,UA: NEGATIVE

## 2018-01-22 NOTE — Progress Notes (Signed)
    Routine Prenatal Care Visit  Subjective  Eileen Parsons is a 28 y.o. G4P1021 at 1832w1d being seen today for ongoing prenatal care.  She is currently monitored for the following issues for this high-risk pregnancy and has Supervision of high risk pregnancy, antepartum; Obesity in pregnancy; and History of shoulder dystocia in prior pregnancy, currently pregnant on their problem list.  ----------------------------------------------------------------------------------- Patient reports no complaints.   Contractions: Irregular. Vag. Bleeding: None.  Movement: Present. No leaking of fluid.  ----------------------------------------------------------------------------------- The following portions of the patient's history were reviewed and updated as appropriate: allergies, current medications, past family history, past medical history, past social history, past surgical history and problem list. Problem list updated.  Objective  Blood pressure 120/80, weight 276 lb (125.2 kg), last menstrual period 05/02/2017. Pregravid weight 250 lb (113.4 kg) Total Weight Gain 26 lb (11.8 kg) Body mass index is 45.93 kg/m.   Urinalysis: Urine dipstick shows negative for protein, small glucose. Fetal Status:     Movement: Present     General:  Alert, oriented and cooperative. Patient is in no acute distress.  Skin: Skin is warm and dry. No rash noted.   Cardiovascular: Normal heart rate noted  Respiratory: Normal respiratory effort, no problems with respiration noted  Abdomen: Soft, gravid, appropriate for gestational age. Pain/Pressure: Present     Pelvic:  Cervical exam deferred        Extremities: Normal range of motion.     Mental Status: Normal mood and affect. Normal behavior. Normal judgment and thought content.   NST Baseline: 140 Variability: moderate Accelerations: present Decelerations: present, variable < 10 seconds Tocometry: not done The patient was monitored for 20+ minutes, fetal heart  rate tracing was deemed reactive.  Assessment   27 y.o. [id number] at 1832w1d, EDD 02/18/2018 Alternate EDD Entry presenting for a routine prenatal visit.  Plan   Pregnancy#4 Problems (from 07/24/17 to present)    Problem Noted Resolved   History of shoulder dystocia in prior pregnancy, currently pregnant 07/26/2017 by Farrel ConnersGutierrez, Colleen, CNM No   Overview Addendum 10/02/2017 11:03 AM by Vena AustriaStaebler, Andreas, MD    Review of [facility] records: 90 sec shoulder dystocia/ SVD Discussed risks of shoulder dystocia and offer CS for delivery Pelvis tested to 8lbs 11oz [ ]  36 week growth          Growth scan today showed growth percentile at 44.6, EFW 6 lb, 3 oz, AFI 12.1 cm, cephalic presentation.  NST reactive, baby active and occasionally hard to trace.  General obstetric precautions were reviewed.  Please refer to After Visit Summary for other counseling recommendations.   Return in about 1 week (around 01/29/2018) for ROB with NST/AFI.  Marcelyn BruinsJacelyn Schmid, CNM 01/22/2018  1:33 PM

## 2018-01-23 LAB — CERVICOVAGINAL ANCILLARY ONLY
Chlamydia: NEGATIVE
Neisseria Gonorrhea: NEGATIVE

## 2018-01-23 NOTE — Patient Instructions (Signed)
Third Trimester of Pregnancy The third trimester is from week 28 through week 40 (months 7 through 9). The third trimester is a time when the unborn baby (fetus) is growing rapidly. At the end of the ninth month, the fetus is about 20 inches in length and weighs 6-10 pounds. Body changes during your third trimester Your body will continue to go through many changes during pregnancy. The changes vary from woman to woman. During the third trimester:  Your weight will continue to increase. You can expect to gain 25-35 pounds (11-16 kg) by the end of the pregnancy.  You may begin to get stretch marks on your hips, abdomen, and breasts.  You may urinate more often because the fetus is moving lower into your pelvis and pressing on your bladder.  You may develop or continue to have heartburn. This is caused by increased hormones that slow down muscles in the digestive tract.  You may develop or continue to have constipation because increased hormones slow digestion and cause the muscles that push waste through your intestines to relax.  You may develop hemorrhoids. These are swollen veins (varicose veins) in the rectum that can itch or be painful.  You may develop swollen, bulging veins (varicose veins) in your legs.  You may have increased body aches in the pelvis, back, or thighs. This is due to weight gain and increased hormones that are relaxing your joints.  You may have changes in your hair. These can include thickening of your hair, rapid growth, and changes in texture. Some women also have hair loss during or after pregnancy, or hair that feels dry or thin. Your hair will most likely return to normal after your baby is born.  Your breasts will continue to grow and they will continue to become tender. A yellow fluid (colostrum) may leak from your breasts. This is the first milk you are producing for your baby.  Your belly button may stick out.  You may notice more swelling in your hands,  face, or ankles.  You may have increased tingling or numbness in your hands, arms, and legs. The skin on your belly may also feel numb.  You may feel short of breath because of your expanding uterus.  You may have more problems sleeping. This can be caused by the size of your belly, increased need to urinate, and an increase in your body's metabolism.  You may notice the fetus "dropping," or moving lower in your abdomen (lightening).  You may have increased vaginal discharge.  You may notice your joints feel loose and you may have pain around your pelvic bone. What to expect at prenatal visits You will have prenatal exams every 2 weeks until week 36. Then you will have weekly prenatal exams. During a routine prenatal visit:  You will be weighed to make sure you and the baby are growing normally.  Your blood pressure will be taken.  Your abdomen will be measured to track your baby's growth.  The fetal heartbeat will be listened to.  Any test results from the previous visit will be discussed.  You may have a cervical check near your due date to see if your cervix has softened or thinned (effaced).  You will be tested for Group B streptococcus. This happens between 35 and 37 weeks. Your health care provider may ask you:  What your birth plan is.  How you are feeling.  If you are feeling the baby move.  If you have had any abnormal   symptoms, such as leaking fluid, bleeding, severe headaches, or abdominal cramping.  If you are using any tobacco products, including cigarettes, chewing tobacco, and electronic cigarettes.  If you have any questions. Other tests or screenings that may be performed during your third trimester include:  Blood tests that check for low iron levels (anemia).  Fetal testing to check the health, activity level, and growth of the fetus. Testing is done if you have certain medical conditions or if there are problems during the pregnancy.  Nonstress test  (NST). This test checks the health of your baby to make sure there are no signs of problems, such as the baby not getting enough oxygen. During this test, a belt is placed around your belly. The baby is made to move, and its heart rate is monitored during movement. What is false labor? False labor is a condition in which you feel small, irregular tightenings of the muscles in the womb (contractions) that usually go away with rest, changing position, or drinking water. These are called Braxton Hicks contractions. Contractions may last for hours, days, or even weeks before true labor sets in. If contractions come at regular intervals, become more frequent, increase in intensity, or become painful, you should see your health care provider. What are the signs of labor?  Abdominal cramps.  Regular contractions that start at 10 minutes apart and become stronger and more frequent with time.  Contractions that start on the top of the uterus and spread down to the lower abdomen and back.  Increased pelvic pressure and dull back pain.  A watery or bloody mucus discharge that comes from the vagina.  Leaking of amniotic fluid. This is also known as your "water breaking." It could be a slow trickle or a gush. Let your health care provider know if it has a color or strange odor. If you have any of these signs, call your health care provider right away, even if it is before your due date. Follow these instructions at home: Medicines  Follow your health care provider's instructions regarding medicine use. Specific medicines may be either safe or unsafe to take during pregnancy.  Take a prenatal vitamin that contains at least 600 micrograms (mcg) of folic acid.  If you develop constipation, try taking a stool softener if your health care provider approves. Eating and drinking   Eat a balanced diet that includes fresh fruits and vegetables, whole grains, good sources of protein such as meat, eggs, or tofu,  and low-fat dairy. Your health care provider will help you determine the amount of weight gain that is right for you.  Avoid raw meat and uncooked cheese. These carry germs that can cause birth defects in the baby.  If you have low calcium intake from food, talk to your health care provider about whether you should take a daily calcium supplement.  Eat four or five small meals rather than three large meals a day.  Limit foods that are high in fat and processed sugars, such as fried and sweet foods.  To prevent constipation: ? Drink enough fluid to keep your urine clear or pale yellow. ? Eat foods that are high in fiber, such as fresh fruits and vegetables, whole grains, and beans. Activity  Exercise only as directed by your health care provider. Most women can continue their usual exercise routine during pregnancy. Try to exercise for 30 minutes at least 5 days a week. Stop exercising if you experience uterine contractions.  Avoid heavy lifting.  Do   not exercise in extreme heat or humidity, or at high altitudes.  Wear low-heel, comfortable shoes.  Practice good posture.  You may continue to have sex unless your health care provider tells you otherwise. Relieving pain and discomfort  Take frequent breaks and rest with your legs elevated if you have leg cramps or low back pain.  Take warm sitz baths to soothe any pain or discomfort caused by hemorrhoids. Use hemorrhoid cream if your health care provider approves.  Wear a good support bra to prevent discomfort from breast tenderness.  If you develop varicose veins: ? Wear support pantyhose or compression stockings as told by your healthcare provider. ? Elevate your feet for 15 minutes, 3-4 times a day. Prenatal care  Write down your questions. Take them to your prenatal visits.  Keep all your prenatal visits as told by your health care provider. This is important. Safety  Wear your seat belt at all times when driving.  Make  a list of emergency phone numbers, including numbers for family, friends, the hospital, and police and fire departments. General instructions  Avoid cat litter boxes and soil used by cats. These carry germs that can cause birth defects in the baby. If you have a cat, ask someone to clean the litter box for you.  Do not travel far distances unless it is absolutely necessary and only with the approval of your health care provider.  Do not use hot tubs, steam rooms, or saunas.  Do not drink alcohol.  Do not use any products that contain nicotine or tobacco, such as cigarettes and e-cigarettes. If you need help quitting, ask your health care provider.  Do not use any medicinal herbs or unprescribed drugs. These chemicals affect the formation and growth of the baby.  Do not douche or use tampons or scented sanitary pads.  Do not cross your legs for long periods of time.  To prepare for the arrival of your baby: ? Take prenatal classes to understand, practice, and ask questions about labor and delivery. ? Make a trial run to the hospital. ? Visit the hospital and tour the maternity area. ? Arrange for maternity or paternity leave through employers. ? Arrange for family and friends to take care of pets while you are in the hospital. ? Purchase a rear-facing car seat and make sure you know how to install it in your car. ? Pack your hospital bag. ? Prepare the baby's nursery. Make sure to remove all pillows and stuffed animals from the baby's crib to prevent suffocation.  Visit your dentist if you have not gone during your pregnancy. Use a soft toothbrush to brush your teeth and be gentle when you floss. Contact a health care provider if:  You are unsure if you are in labor or if your water has broken.  You become dizzy.  You have mild pelvic cramps, pelvic pressure, or nagging pain in your abdominal area.  You have lower back pain.  You have persistent nausea, vomiting, or  diarrhea.  You have an unusual or bad smelling vaginal discharge.  You have pain when you urinate. Get help right away if:  Your water breaks before 37 weeks.  You have regular contractions less than 5 minutes apart before 37 weeks.  You have a fever.  You are leaking fluid from your vagina.  You have spotting or bleeding from your vagina.  You have severe abdominal pain or cramping.  You have rapid weight loss or weight gain.  You have   shortness of breath with chest pain.  You notice sudden or extreme swelling of your face, hands, ankles, feet, or legs.  Your baby makes fewer than 10 movements in 2 hours.  You have severe headaches that do not go away when you take medicine.  You have vision changes. Summary  The third trimester is from week 28 through week 40, months 7 through 9. The third trimester is a time when the unborn baby (fetus) is growing rapidly.  During the third trimester, your discomfort may increase as you and your baby continue to gain weight. You may have abdominal, leg, and back pain, sleeping problems, and an increased need to urinate.  During the third trimester your breasts will keep growing and they will continue to become tender. A yellow fluid (colostrum) may leak from your breasts. This is the first milk you are producing for your baby.  False labor is a condition in which you feel small, irregular tightenings of the muscles in the womb (contractions) that eventually go away. These are called Braxton Hicks contractions. Contractions may last for hours, days, or even weeks before true labor sets in.  Signs of labor can include: abdominal cramps; regular contractions that start at 10 minutes apart and become stronger and more frequent with time; watery or bloody mucus discharge that comes from the vagina; increased pelvic pressure and dull back pain; and leaking of amniotic fluid. This information is not intended to replace advice given to you by your  health care provider. Make sure you discuss any questions you have with your health care provider. Document Released: 12/28/2000 Document Revised: 02/09/2016 Document Reviewed: 02/09/2016 Elsevier Interactive Patient Education  2019 [employer]  

## 2018-01-24 LAB — STREP GP B NAA: Strep Gp B NAA: NEGATIVE

## 2018-01-26 ENCOUNTER — Telehealth: Payer: Self-pay

## 2018-01-26 NOTE — Telephone Encounter (Signed)
Called pt back.  She states nothing is happening.  Pad is dry.  Adv to monitor and if she isn't staying dry she needs to be seen.

## 2018-01-26 NOTE — Telephone Encounter (Signed)
Pt  Is 36.5 wks. Last night felt like water was leaking but no ctxs.  What to do?  [phone]  Pt states she had 3 gushes of water last night, no pain.  States nothing is going on today.  Adv to put on a pad and call me back in an hour and let me know if she is dry or not.

## 2018-01-29 ENCOUNTER — Other Ambulatory Visit: Payer: Self-pay

## 2018-01-29 ENCOUNTER — Telehealth: Payer: Self-pay

## 2018-01-29 ENCOUNTER — Observation Stay
Admission: EM | Admit: 2018-01-29 | Discharge: 2018-01-30 | Disposition: A | Discharge status: Home / Self Care | Payer: BLUE CROSS/BLUE SHIELD | Attending: Advanced Practice Midwife | Admitting: Advanced Practice Midwife

## 2018-01-29 ENCOUNTER — Observation Stay
Admit: 2018-01-29 | Discharge: 2018-01-29 | Disposition: A | Discharge status: Home / Self Care | Payer: BLUE CROSS/BLUE SHIELD | Attending: Obstetrics and Gynecology | Admitting: Obstetrics and Gynecology

## 2018-01-29 ENCOUNTER — Ambulatory Visit (INDEPENDENT_AMBULATORY_CARE_PROVIDER_SITE_OTHER): Payer: BLUE CROSS/BLUE SHIELD

## 2018-01-29 ENCOUNTER — Ambulatory Visit (INDEPENDENT_AMBULATORY_CARE_PROVIDER_SITE_OTHER): Payer: BLUE CROSS/BLUE SHIELD | Admitting: Certified Nurse Midwife

## 2018-01-29 VITALS — BP 110/68 | Wt 281.0 lb

## 2018-01-29 DIAGNOSIS — O368390 Maternal care for abnormalities of the fetal heart rate or rhythm, unspecified trimester, not applicable or unspecified: Secondary | ICD-10-CM

## 2018-01-29 DIAGNOSIS — O09299 Supervision of pregnancy with other poor reproductive or obstetric history, unspecified trimester: Secondary | ICD-10-CM

## 2018-01-29 DIAGNOSIS — O368330 Maternal care for abnormalities of the fetal heart rate or rhythm, third trimester, not applicable or unspecified: Secondary | ICD-10-CM | POA: Diagnosis not present

## 2018-01-29 DIAGNOSIS — Z3A37 37 weeks gestation of pregnancy: Secondary | ICD-10-CM

## 2018-01-29 DIAGNOSIS — Z3689 Encounter for other specified antenatal screening: Secondary | ICD-10-CM | POA: Diagnosis not present

## 2018-01-29 DIAGNOSIS — O0990 Supervision of high risk pregnancy, unspecified, unspecified trimester: Secondary | ICD-10-CM

## 2018-01-29 DIAGNOSIS — O99210 Obesity complicating pregnancy, unspecified trimester: Secondary | ICD-10-CM

## 2018-01-29 DIAGNOSIS — O99213 Obesity complicating pregnancy, third trimester: Secondary | ICD-10-CM

## 2018-01-29 DIAGNOSIS — Z369 Encounter for antenatal screening, unspecified: Secondary | ICD-10-CM

## 2018-01-29 HISTORY — DX: Other specified health status: Z78.9

## 2018-01-29 LAB — POCT URINALYSIS DIPSTICK OB: Glucose, UA: NEGATIVE

## 2018-01-29 MED ORDER — LACTATED RINGERS IV BOLUS
500.0000 mL | Freq: Once | INTRAVENOUS | Status: AC
  Administered 2018-01-29: 500 mL via INTRAVENOUS

## 2018-01-29 MED ORDER — LACTATED RINGERS IV SOLN
INTRAVENOUS | Status: DC
  Administered 2018-01-29 – 2018-01-30 (×2): via INTRAVENOUS

## 2018-01-29 NOTE — Progress Notes (Signed)
AFI/NST today. Intermittent contractions. Pain & pressure. Desires cervix check today.

## 2018-01-29 NOTE — Progress Notes (Signed)
HROB at 37wk1d: Here for NST for BMI>40. Very irregular fetal heart rate with baseline 135 and frequent dropped beats x 20-40 seconds and FHR decreases to 70 BPM. Patient reports good fetal movement and irregular contractions.  AFI=10.9cm/ cephalic Dr Bonney Aid talked with patient and partner about further monitoring and evaluation by [facility]Ms for PACs vs heart block.  To L&D for further evaluation. Also needs anesthesia consult if discharged from L&D.  [name], CNM

## 2018-01-29 NOTE — Consult Note (Signed)
[facility] Consultation   Chief Complaint: Irregularr FHR    HPI: Eileen Parsons is a 28 y.o. [phone] at 6685w1d  [facility]   for irregular FHR on NST today . Pt was getting weekly NST for maternal obesity . Anatomy scan was normal by report as was last weeks NST. Pt reports growth scan one week ago measured 2812 g a tthe 45% .  Pt had an uncomplicated prior pregnancy and was delivered by SVD which she strongly desires for this pregnancy. Notably - "Her prior pregnancies are notable for a term SVD in 2014 delivering a 8#11 oz female infant over a ML episiotomy and complicated by a 90 sec shoulder dystocia. She has also had SABs in 2016 and 2019" The nurse reports 5-10 minutes period of category 1 tracing reactive NST with FHR in the 140s with moderate variability and positive accels interspersed with 10-15 seconds and occ 60 second episodes of FHR 80s. The rates appear to flip back and forth without tracing up from the bradycardia . The patient reports good fetal movement. She takes no medciations and only drinks caffeine if she has a HA . No h/o autoimmune disease or thyroid disorder.  Past Medical History: Patient  has a past medical history of History of shoulder dystocia in prior pregnancy and Obesity.  Past Surgical History: She  has a past surgical history that includes Mouth surgery.  Obstetric History:  OB History    Gravida  4   Para  1   Term  1   Preterm      AB  2   Living  1     SAB  2   TAB      Ectopic      Multiple      Live Births  1          Gynecologic History:  Patient's last menstrual period was 05/02/2017 (approximate).    Medications:  Current Facility-Administered Medications:  .  lactated ringers bolus 500 mL, 500 mL, Intravenous, Once, Oswaldo ConroySchmid, Jacelyn Y, CNM .  lactated ringers infusion, , Intravenous, Continuous, Oswaldo ConroySchmid, Jacelyn Y, CNM  Allergies: Patient has No Known Allergies.  Social  History: Patient  reports that she has never smoked. She has never used smokeless tobacco. She reports current alcohol use. She reports that she does not use drugs.  Family History: family history includes Alcohol abuse in her mother; Diabetes Mellitus II in her maternal aunt, paternal aunt, and paternal uncle.  Review of Systems feels well hungry - missed breakfast  Physical Exam: BP 118/73 (BP Location: Left Arm)   Pulse (!) 106   Temp 98.3 F (36.8 C) (Oral)   Resp 18   Ht 5\' 7"  (1.702 m)   Wt 127.5 kg   LMP 05/02/2017 (Approximate)   SpO2 99%   BMI 44.01 kg/m  NST - fetal NSR punctuated by periods of FHR of 80-90  Cephalic normal fluid  BPP 8/8 Several rhythm strips obtained on M-mode - on most with bradycardia    Asessement:  IUP at 37w 1d  -Fetal Arrhythmia - likely PACs with compensatory ventricular pauses on my limited M-Mode assessment  - no evidence of fetal hydrops BPP 8/8 - fetus in sinus 2/3rds of the time on the tracing I reviewed , PACs are usually benign in that they do not usually compromise fetal cardiac output and generally spontaneously resolve  - concern is if they get conducted and a tachyarrhythmia  results that could evolve into compromised fetal CO and hydrops. . 1. History of shoulder dystocia in prior pregnancy, currently pregnant   that fetus was 3.8 kg (8 lb 11 oz) this one is currently 2800  -Obesity BMI 46   Plan: Fetus is early term so proceed with delivery if this arrhythmia is sustained , delay of delivery  at this time might be useful if PACs spontaneously resolve making intrapartum monitoring more diagnostic 1 - Continuous FHR for several hours to assess frequency of bradycardia - if in sinus rhythm majority of time - reasonable to let pt eat repeat BPP tonight and obtain periodic strips to document no sustained arrhythmia  2 iv hydration and advise on necessity of cesarean if sustained bradycardia -  3 neonatology in to meet patient - She feels  that she can obtain EKG and remote echo on neonate if delivered at [facility] and arrhythmia is sustained postnatally 4 consider TFTs , anti ro and anti la on mother , avoid caffeine , terbutaline other beta agonists as able  5 If preferable - given challenges in monitoring and neonatal care , transfer to [facility] or other tertiary care with immediate  pediatric cardiology support for fetal echo to confirm my diagnosis 6 If arrhythmia resolves consideration of twice weekly testing versus proceeding  with induction in case PACs recur   WRT SVD versus primary cesarean I think depends on whether arrhythmia is sustained and nursing comfort with following  Tracing, favorability of cervix and anticipated length of indcution.     Total time spent with the patient was 30 minutes with greater than 50% spent in counseling and coordination of care. We appreciate this interesting consult and will be happy to be involved in the ongoing care of Ms. Candito in anyway her obstetricians desire.  Kirby Funk, MD Maternal-Fetal Medicine [facility]

## 2018-01-29 NOTE — OB Triage Note (Signed)
Pt sent from office for monitoring for irregular fetal heart beat. Pt denies any pain, bleeding, or LOF. Reports positive fetal movement. Vitals WNL. Will continue to monitor.

## 2018-01-29 NOTE — H&P (Signed)
Obstetric H&P   Chief Complaint: Irregular fetal heart rate on fetal non-stress test in clinic  Prenatal Care Provider: [facility]  History of Present Illness: 28 y.o. [id number] 5222w1d by 02/18/2018, Alternate EDD Entry presenting to L&D from clinic with concern for fetal arrythmia during a scheduled non-stress test for elevated BMI. She is having occasional mild contractions. She is not having any vaginal bleeding or loss of fluid. She reports good fetal movement.  Pregravid weight 113.4 kg Total Weight Gain 14.1 kg  Pregnancy#4 Problems (from 07/24/17 to present)    Problem Noted Resolved   History of shoulder dystocia in prior pregnancy, currently pregnant 07/26/2017 by Farrel ConnersGutierrez, Colleen, CNM No   Overview Addendum 10/02/2017 11:03 AM by Vena AustriaStaebler, Andreas, MD    Review of [facility] records: 90 sec shoulder dystocia/ SVD Discussed risks of shoulder dystocia and offer CS for delivery Pelvis tested to 8lbs 11oz [ ]  36 week growth           Review of Systems: 10 point review of systems negative unless otherwise noted in HPI  Past Medical History: Past Medical History:  Diagnosis Date  . History of shoulder dystocia in prior pregnancy    with first pregnancy  . Obesity    BMI=40.25 kb/m2    Past Surgical History: Past Surgical History:  Procedure Laterality Date  . MOUTH SURGERY      Past Obstetric History:  Past Gynecologic History:  Family History: Family History  Problem Relation Age of Onset  . Alcohol abuse Mother   . Diabetes Mellitus II Maternal Aunt   . Diabetes Mellitus II Paternal Aunt   . Diabetes Mellitus II Paternal Uncle   . Breast cancer Neg Hx   . Ovarian cancer Neg Hx   . Colon cancer Neg Hx     Social History: Social History   Socioeconomic History  . Marital status: Single    Spouse name: Rod  . Number of children: 1  . Years of education: Not on file  . Highest education level: Not on file  Occupational History  . Occupation: Advertising copywriterHousekeeper   Social Needs  . Financial resource strain: Not on file  . Food insecurity:    Worry: Not on file    Inability: Not on file  . Transportation needs:    Medical: Not on file    Non-medical: Not on file  Tobacco Use  . Smoking status: Never Smoker  . Smokeless tobacco: Never Used  Substance and Sexual Activity  . Alcohol use: Yes    Comment: weekends-stopped alcohol with pregnancy  . Drug use: No  . Sexual activity: Yes    Birth control/protection: None  Lifestyle  . Physical activity:    Days per week: Not on file    Minutes per session: Not on file  . Stress: Not on file  Relationships  . Social connections:    Talks on phone: Not on file    Gets together: Not on file    Attends religious service: Not on file    Active member of club or organization: Not on file    Attends meetings of clubs or organizations: Not on file    Relationship status: Not on file  . Intimate partner violence:    Fear of current or ex partner: No    Emotionally abused: No    Physically abused: No    Forced sexual activity: No  Other Topics Concern  . Not on file  Social History Narrative  . Not on file  Medications: Prior to Admission medications   Medication Sig Start Date End Date Taking? Authorizing Provider  Prenatal Vit-DSS-Fe Fum-FA (PRENATAL 19) tablet Take 1 tablet by mouth daily. 04/27/17  Yes Emily Filbert, MD  azithromycin (ZITHROMAX Z-PAK) 250 MG tablet Take 2 tablets (500 mg) on  Day 1,  followed by 1 tablet (250 mg) once daily on Days 2 through 5. Patient not taking: Reported on 01/08/2018 12/15/17   Enid Derry, PA-C  folic acid (FOLVITE) 1 MG tablet Take 1 tablet (1 mg total) by mouth daily. Patient not taking: Reported on 01/29/2018 07/24/17   [name], CNM  prochlorperazine (COMPAZINE) 10 MG tablet Take 1 tablet (10 mg total) by mouth every 6 (six) hours as needed for nausea (headaches). Patient not taking: Reported on 12/11/2017 10/02/17   Vena Austria, MD    Allergies: No Known Allergies  Physical Exam: Vitals: Blood pressure 118/73, pulse (!) 106, temperature 98.3 F (36.8 C), temperature source Oral, resp. rate 18, height 5\' 7"  (1.702 m), weight 127.5 kg, last menstrual period 05/02/2017, SpO2 99 %.  FHT: FHR 145-155 with occasional periods of tracing in 80s-90s, not related to contractions and not contiguous with baseline or accelerations  Toco: occasional contractions  General: NAD HEENT: normocephalic, anicteric Pulmonary: No increased work of breathing Cardiovascular: Regular rate, mild tachycardia Abdomen: Gravid, non-tender Genitourinary: deferred Extremities: no edema, erythema, or tenderness Neurologic: Grossly intact Psychiatric: mood appropriate, affect full  Labs: Results for orders placed or performed in visit on 01/29/18 (from the past 24 hour(s))  POC Urinalysis Dipstick OB     Status: Normal   Collection Time: 01/29/18  8:34 AM  Result Value Ref Range   Color, UA     Clarity, UA     Glucose, UA Negative Negative   Bilirubin, UA     Ketones, UA     Spec Grav, UA     Blood, UA     pH, UA     POC,PROTEIN,UA Trace Negative, Trace, Small (1+), Moderate (2+), Large (3+), 4+   Urobilinogen, UA     Nitrite, UA     Leukocytes, UA     Appearance     Odor      Assessment: 28 y.o. K1S0109 [redacted]w[redacted]d by 02/18/2018, Alternate EDD Entry with fetal heart rate irregularity during monitoring.  Plan: 1) MFM consulted, plan for IV fluids, observation. NPO for now. See consult note for details.  2) Fetus - Reassuring overall but periods of fetal bradycardia with irregular rhythm  3) PNL - Blood type O/Positive/-- (07/08 1449) / Anti-bodyscreen Negative (07/08 1449) / Rubella 1.20 (07/08 1449) / RPR Non Reactive (11/11 1121) / HBsAg Negative (07/08 1449) / HIV Non Reactive (11/11 1121) / GBS Negative (01/06 1403)  4) Immunization History -  Immunization History  Administered Date(s) Administered  . Tdap  12/11/2017    5) Disposition - continue observation of fetal well-being to determine plan for monitoring versus delivery or transfer to [facility]  Marcelyn Bruins, CNM 01/29/2018

## 2018-01-29 NOTE — Progress Notes (Signed)
Patient's fetal heart rate strip looks normal after start of hydration via IV fluids at 1308. However, fetal heart rate dips still audible in patient's room. The pauses just aren't as long so the monitor isn't picking it up. Provider notified. Will continue to monitor.

## 2018-01-30 ENCOUNTER — Other Ambulatory Visit: Payer: Self-pay

## 2018-01-30 ENCOUNTER — Observation Stay: Payer: BLUE CROSS/BLUE SHIELD

## 2018-01-30 DIAGNOSIS — O368330 Maternal care for abnormalities of the fetal heart rate or rhythm, third trimester, not applicable or unspecified: Secondary | ICD-10-CM | POA: Diagnosis not present

## 2018-01-30 DIAGNOSIS — Z3A37 37 weeks gestation of pregnancy: Secondary | ICD-10-CM | POA: Diagnosis not present

## 2018-01-30 DIAGNOSIS — O0990 Supervision of high risk pregnancy, unspecified, unspecified trimester: Secondary | ICD-10-CM

## 2018-01-30 MED ORDER — ACETAMINOPHEN 500 MG PO TABS
1000.0000 mg | ORAL_TABLET | Freq: Four times a day (QID) | ORAL | Status: DC | PRN
  Administered 2018-01-30: 1000 mg via ORAL
  Filled 2018-01-30: qty 2

## 2018-01-30 NOTE — Progress Notes (Signed)
S: Patient is resting comfortably in dark room. She has no complaints. She has ordered breakfast and denies any needs at this time.  O: BP 129/76 (BP Location: Left Arm)   Pulse 88   Temp 98.1 F (36.7 C) (Oral)   Resp 18   Ht 5\' 7"  (1.702 m)   Wt 127.5 kg   LMP 05/02/2017 (Approximate)   SpO2 99%   BMI 44.01 kg/m  Constitutional: Well nourished, well developed female in no acute distress.  HEENT: normal Skin: Warm and dry.  Cardiovascular: Regular rate and rhythm.   Extremity: no evidence of DVT  Respiratory: Clear to auscultation bilateral. Normal respiratory effort Abdomen: FHT present Psych: Alert and Oriented x3. No memory deficits. Normal mood and affect.   Fetal well being: baseline 135, moderate variability, +accelerations, -decelerations, a few short PACs seen in the last 1.5 hours. Toco: Occasional contraction  A: 28 yo G4 P1021 with IUP at 37 weeks 2 days with fetal arrhythmia  P: Repeat BPP today per Dr Leatha Gilding MFM recommendation Delivery timing and method depending on fetal heart rhythm- resolution or worsening of arrhythmia  Disposition: will discuss with Dr Trecia Rogers, CNM

## 2018-01-30 NOTE — OB Triage Note (Deleted)
Pt presents with CTX for the last 2 hours. Denies N/V, intercourse in the last 24 hours, decreased fetal movement and bleeding. Does endorse diarrhea for 2 days.

## 2018-01-30 NOTE — Discharge Instructions (Signed)
Pain Relief During Labor and Delivery  Many things can cause pain during labor and delivery, including:  · Pressure on bones and ligaments due to the baby moving through the pelvis.  · Stretching of tissues due to the baby moving through the birth canal.  · Muscle tension due to anxiety or nervousness.  · The uterus tightening (contracting) and relaxing to help move the baby.  There are many ways to deal with the pain of labor and delivery. They include:  · Taking prenatal classes. Taking these classes helps you know what to expect during your baby’s birth. What you learn will increase your confidence and decrease your anxiety.  · Practicing relaxation techniques or doing relaxing activities, such as:  ? Focused breathing.  ? Meditation.  ? Visualization.  ? Aroma therapy.  ? Listening to your favorite music.  ? Hypnosis.  · Taking a warm shower or bath (hydrotherapy). This may:  ? Provide comfort and relaxation.  ? Lessen your perception of pain.  ? Decrease the amount of pain medicine needed.  ? Decrease the length of labor.  · Getting a massage or counterpressure on your back.  · Applying warm packs or ice packs.  · Changing positions often, moving around, or using a birthing ball.  · Getting:  ? Pain medicine through an IV or injection into a muscle.  ? Pain medicine inserted into your spinal column.  ? Injections of sterile water just under the skin on your lower back (intradermal injections).  ? Laughing gas (nitrous oxide).  Discuss your pain control options with your health care provider during your prenatal visits. Explore the options offered by your hospital or birth center.  What kinds of medicine are available?  There are two kinds of medicines that can be used to relieve pain during labor and delivery:  · Analgesics. These medicines decrease pain without causing you to lose feeling or the ability to move your muscles.  · Anesthetics. These medicines block feeling in the body and can decrease your  ability to move freely.  Both of these kinds of medicine can cause minor side effects, such as nausea, trouble concentrating, and sleepiness. They can also decrease the baby's heart rate before birth and affect the baby’s breathing rate after birth. For this reason, health care providers are careful about when and how much medicine is given.  What are specific medicines and procedures that provide pain relief?  Local Anesthetics  Local anesthetics are used to numb a small area of the body. They may be used along with another kind of anesthetic or used to numb the nerves of the vagina, cervix, and perineum during the second stage of labor.  General Anesthetics  General anesthetics cause you to lose consciousness so you do not feel pain. They are usually only used for an emergency cesarean delivery. General anesthetics are given through an IV tube and a mask.  Pudendal Block  A pudendal block is a form of local anesthetic. It may be used to relieve the pain associated with pushing or stretching of the perineum at the time of delivery or to further numb the perineum. A pudendal block is done by injecting numbing medicine through the vaginal wall into a nerve in the pelvis.  Epidural Analgesia  Epidural analgesia is given through a flexible IV catheter that is inserted into the lower back. Numbing medicine is delivered continuously to the area near your spinal column nerves (epidural space). After having this type of analgesia, you   may be able to move your legs but you most likely will not be able to walk. Depending on the amount of medicine given, you may lose all feeling in the lower half of your body, or you may retain some level of sensation, including the urge to push. Epidural analgesia can be used to provide pain relief for a vaginal birth.  Spinal Block  A spinal block is similar to epidural analgesia, but the medicine is injected into the spinal fluid instead of the epidural space. A spinal block is only given  once. It starts to relieve pain quickly, but the pain relief lasts only 1-6 hours. Spinal blocks can be used for cesarean deliveries.  Combined Spinal-Epidural (CSE) Block  A CSE block combines the effects of a spinal block and epidural analgesia. The spinal block works quickly to block all pain. The epidural analgesia provides continuous pain relief, even after the effects of the spinal block have worn off.  This information is not intended to replace advice given to you by your health care provider. Make sure you discuss any questions you have with your health care provider.  Document Released: 04/21/2008 Document Revised: 06/12/2015 Document Reviewed: 05/27/2015  [employer]vier Interactive Patient Education © 2019 [employer]

## 2018-01-30 NOTE — Discharge Summary (Signed)
Physician Final Progress Note  Patient ID: Eileen Parsons Murgia MRN: [id number] DOB/AGE: 1990/07/23 28 y.o.  Admit date: 01/29/2018 Admitting provider: Nadara Mustardobert P Harris, MD Discharge date: 01/30/2018   Admission Diagnoses:  Fetal arythmia  Discharge Diagnoses:  Active Problems:   Indication for care in labor and delivery, antepartum   28 y.o. [id number] at 5912w2d who was noted to have a fetal arhythmia on routine prenatal visit.  She was sent to L&D for additional monitoring.  MFM was consulted an M mode ultrasound was most consistent with possible PAC.  BPP was 8/8, with repeat BPP after 24-hrs of fetal monitoring showing reactive NST also 8/8.  Anti-Ro and Anti-La antibodies were sent and are pending although her arhythmia was not consistent with fetal heart block.  Throughout admission good fetal movement, no LOF, no VB, no contractions.  Recommendation per MFM is to continue twice weekly fetal surveillance.    Consults: MFM  Significant Findings/ Diagnostic Studies:  Results for orders placed or performed in visit on 01/29/18 (from the past 48 hour(s))  POC Urinalysis Dipstick OB     Status: Normal   Collection Time: 01/29/18  8:34 AM  Result Value Ref Range   Color, UA     Clarity, UA     Glucose, UA Negative Negative   Bilirubin, UA     Ketones, UA     Spec Grav, UA     Blood, UA     pH, UA     POC,PROTEIN,UA Trace Negative, Trace, Small (1+), Moderate (2+), Large (3+), 4+   Urobilinogen, UA     Nitrite, UA     Leukocytes, UA     Appearance     Odor       [place] Ob Limited  Result Date: 01/29/2018 Patient Name: Eileen Parsons Kelleher DOB: 1990/07/23 MRN: [id number] ULTRASOUND REPORT Location: [facility] Date of Service: 01/29/2018 Indications:AFI Findings: Mason JimSingleton intrauterine pregnancy is visualized with FHR at 134 BPM. Fetal presentation is Cephalic. Placenta: posterior. Grade: 2 AFI: 10.9 cm Impression: 1. 2628w1d Viable Singleton Intrauterine pregnancy dated by previously established  criteria. 2. AFI is 10.9 cm. Recommendations: 1.Clinical correlation with the patient's History and Physical Exam. Darlina GuysAbby M Clarke, RDMS RVT Review of ULTRASOUND.    I have personally reviewed images and report of recent ultrasound done at [facility].    Plan of management to be discussed with patient. Annamarie MajorPaul Harris, MD, Merlinda FrederickFACOG [facility], [facility] 01/29/2018  10:00 AM   [place] Fetal Bpp Wo Non Stress  Result Date: 01/30/2018 CLINICAL DATA:  Current assigned gestational age of [redacted] weeks 2 days. Fetal arrhythmia. EXAM: BIOPHYSICAL PROFILE FINDINGS: Single living intrauterine fetus is seen in cephalic presentation. Movement: 2 time: 15 minutes Breathing: 2 Tone: 2 Amniotic Fluid: 2 Total Score: 8 IMPRESSION: Biophysical profile score of 8/8. Electronically Signed   By: Myles RosenthalJohn  Stahl M.D.   On: 01/30/2018 11:36   [place] Mfm Ob Limited  Result Date: 01/29/2018 ----------------------------------------------------------------------  OBSTETRICS REPORT                       (Signed Final 01/29/2018 03:52 pm) ---------------------------------------------------------------------- PATIENT INFO:  ID #:       [id number]                          D.O.B.:  21-Dec-1990 (27 yrs)  Name:       Eileen BoettcherARRIEL Sagar  Visit Date: 01/29/2018 03:04 pm ---------------------------------------------------------------------- PERFORMED BY:  Performed By:     Ceasar MonsJennifer Broe          Referred By:      Maryellen PileJACELYN Y                    Sonographer                              SCHMID ---------------------------------------------------------------------- SERVICE(S) PROVIDED:   US MFM OB LIMITED                                    [phone]  ---------------------------------------------------------------------- INDICATIONS:   [redacted] weeks gestation of pregnancy                Z3A.37  ---------------------------------------------------------------------- FETAL EVALUATION:  Num Of Fetuses:         1  Fetal Heart Rate(bpm):  155  Presentation:            Vertex  Placenta:               Posterior  Amniotic Fluid  AFI FV:      Within normal limits  AFI Sum(cm)     %Tile       Largest Pocket(cm)  15.6            59          5.1 ---------------------------------------------------------------------- BIOPHYSICAL EVALUATION:  Amniotic F.V:   Within normal limits       F. Tone:        Observed  F. Movement:    Observed                   Score:          8/8  F. Breathing:   Observed ---------------------------------------------------------------------- GESTATIONAL AGE:  Best:          37w 1d     Det. ByMarcella Dubs:  Early Ultrasound         EDD:   02/18/18                                      (07/05/17) ---------------------------------------------------------------------- ANATOMY:  Stomach:               Seen                   Bladder:                Seen ---------------------------------------------------------------------- IMPRESSION:  Dear Dr Tiburcio PeaHarris  Thank you for referring your patient for a fetal biophysical  profile (BPP) due to fetal heart rate arrhythmia noted on  today's NST  at 37 1/7 weeks ( NST done for obesity ) Prior  NST was normal.  There is a singleton gestation with normal amniotic fluid  volume in cephalic position - no hydrops .  The BPP was noted to be 8/8, which was reassuring.  The periods of the FHR tracing in sinus rhythm showed a  reactive tracing. However, there were periods with an abrupt  drop to the 80-90s with abrupt recovery to the 140s .  M mode was applied and freqent non conducted   Premature  atrial contractions (PACs) were noted on M Mode.  The patient was counseled that  the majority of time, PACs  are benign and usually resolve before or shortly after birth.  1-  3% can develop a sustained tachyarrhythmia.  A fetal or  neonatal  echo is nonetheless recommended.  See consult note - in summary:  Avoid caffeine  check TSH, check U tox,  anti ro and la  avoid beta agonists .  Prolonged monitoring.  I spoke with Dr Tiburcio Pea .  The patient is early  term.It might be helpful to delay delivery  as PACs are usually benign and will frequently resolve, and  the fetus is currently stable by BPP and pt prefers vagianl  birth ( despite her h/o of shoulder dystocia that fetus was  1000 g larger for that birth)  I think Recommendations for  timing of delivery and mode of  delivery depends on how much of the time the FHR remains  in sinus rhythm . If it becomes too difficult to assess fetal well  being then moving to cesarean is indicated to allow for  neonatal cardiac assessment . Also if desired to have  delivery closer to pediatric cardiology support we can care for  her at [facility].  If PACs are rare then reasonable to discharge and have twice  weekly NST follow up.  Jimmey Ralph MD  [phone] pager ----------------------------------------------------------------------              Jimmey Ralph, MD Electronically Signed Final Report   01/29/2018 03:52 pm ----------------------------------------------------------------------    Procedures:  Baseline: *140 Variability: moderate Accelerations: present Decelerations: absent Tocometry: none The patient was monitored for 30 minutes, fetal heart rate tracing was deemed reactive, category I tracing,   Discharge Condition: good  Disposition: Discharge disposition: 01-Home or Self Care       Diet: Regular diet  Discharge Activity: Activity as tolerated  Discharge Instructions    Discharge activity:  No Restrictions   Complete by:  As directed    Discharge diet:  No restrictions   Complete by:  As directed    Fetal Kick Count:  Lie on our left side for one hour after a meal, and count the number of times your baby kicks.  If it is less than 5 times, get up, move around and drink some juice.  Repeat the test 30 minutes later.  If it is still less than 5 kicks in an hour, notify your doctor.   Complete by:  As directed    LABOR:  When conractions begin, you should start to time them  from the beginning of one contraction to the beginning  of the next.  When contractions are 5 - 10 minutes apart or less and have been regular for at least an hour, you should call your health care provider.   Complete by:  As directed    No sexual activity restrictions   Complete by:  As directed    Notify physician for bleeding from the vagina   Complete by:  As directed    Notify physician for blurring of vision or spots before the eyes   Complete by:  As directed    Notify physician for chills or fever   Complete by:  As directed    Notify physician for fainting spells, "black outs" or loss of consciousness   Complete by:  As directed    Notify physician for increase in vaginal discharge   Complete by:  As directed    Notify physician for leaking of fluid   Complete by:  As  directed    Notify physician for pain or burning when urinating   Complete by:  As directed    Notify physician for pelvic pressure (sudden increase)   Complete by:  As directed    Notify physician for severe or continued nausea or vomiting   Complete by:  As directed    Notify physician for sudden gushing of fluid from the vagina (with or without continued leaking)   Complete by:  As directed    Notify physician for sudden, constant, or occasional abdominal pain   Complete by:  As directed    Notify physician if baby moving less than usual   Complete by:  As directed      Allergies as of 01/30/2018   No Known Allergies     Medication List    STOP taking these medications   azithromycin 250 MG tablet Commonly known as:  ZITHROMAX Z-PAK     TAKE these medications   folic acid 1 MG tablet Commonly known as:  FOLVITE Take 1 tablet (1 mg total) by mouth daily.   PRENATAL 19 tablet Take 1 tablet by mouth daily.   prochlorperazine 10 MG tablet Commonly known as:  COMPAZINE Take 1 tablet (10 mg total) by mouth every 6 (six) hours as needed for nausea (headaches).      Follow-up Information     [facility], [place] Follow up on 02/02/2018.   Why:  1/16 or 1/17 routine OB and NST Contact information: [address] [place] [address] [phone]           Total time spent taking care of this patient: 60 minutes  Signed: Vena Austria 01/30/2018, 12:10 PM

## 2018-02-01 ENCOUNTER — Encounter
Admission: RE | Admit: 2018-02-01 | Discharge: 2018-02-01 | Disposition: A | Discharge status: Home / Self Care | Payer: BLUE CROSS/BLUE SHIELD | Source: Ambulatory Visit | Attending: Anesthesiology | Admitting: Anesthesiology

## 2018-02-01 ENCOUNTER — Other Ambulatory Visit: Payer: Self-pay

## 2018-02-01 ENCOUNTER — Ambulatory Visit
Admit: 2018-02-01 | Discharge: 2018-02-01 | Disposition: A | Discharge status: Home / Self Care | Payer: BLUE CROSS/BLUE SHIELD | Attending: Maternal & Fetal Medicine | Admitting: Maternal & Fetal Medicine

## 2018-02-01 ENCOUNTER — Encounter: Payer: Self-pay | Admitting: Maternal Newborn

## 2018-02-01 ENCOUNTER — Ambulatory Visit (INDEPENDENT_AMBULATORY_CARE_PROVIDER_SITE_OTHER): Payer: BLUE CROSS/BLUE SHIELD | Admitting: Maternal Newborn

## 2018-02-01 ENCOUNTER — Inpatient Hospital Stay
Admission: EM | Admit: 2018-02-01 | Discharge: 2018-02-04 | DRG: 787 | Disposition: A | Discharge status: Home / Self Care | Payer: BLUE CROSS/BLUE SHIELD | Attending: Obstetrics and Gynecology | Admitting: Obstetrics and Gynecology

## 2018-02-01 ENCOUNTER — Inpatient Hospital Stay: Payer: BLUE CROSS/BLUE SHIELD | Admitting: Anesthesiology

## 2018-02-01 ENCOUNTER — Encounter
Admission: EM | Disposition: A | Discharge status: Home / Self Care | Payer: Self-pay | Source: Home / Self Care | Attending: Obstetrics and Gynecology

## 2018-02-01 VITALS — BP 122/80 | Wt 282.0 lb

## 2018-02-01 DIAGNOSIS — O9081 Anemia of the puerperium: Secondary | ICD-10-CM | POA: Diagnosis not present

## 2018-02-01 DIAGNOSIS — Z3A37 37 weeks gestation of pregnancy: Secondary | ICD-10-CM | POA: Diagnosis not present

## 2018-02-01 DIAGNOSIS — O368330 Maternal care for abnormalities of the fetal heart rate or rhythm, third trimester, not applicable or unspecified: Secondary | ICD-10-CM

## 2018-02-01 DIAGNOSIS — O09299 Supervision of pregnancy with other poor reproductive or obstetric history, unspecified trimester: Secondary | ICD-10-CM

## 2018-02-01 DIAGNOSIS — O0990 Supervision of high risk pregnancy, unspecified, unspecified trimester: Secondary | ICD-10-CM

## 2018-02-01 DIAGNOSIS — O368390 Maternal care for abnormalities of the fetal heart rate or rhythm, unspecified trimester, not applicable or unspecified: Secondary | ICD-10-CM | POA: Insufficient documentation

## 2018-02-01 DIAGNOSIS — D62 Acute posthemorrhagic anemia: Secondary | ICD-10-CM | POA: Diagnosis not present

## 2018-02-01 DIAGNOSIS — E669 Obesity, unspecified: Secondary | ICD-10-CM | POA: Diagnosis present

## 2018-02-01 DIAGNOSIS — O99214 Obesity complicating childbirth: Secondary | ICD-10-CM

## 2018-02-01 LAB — FETAL NONSTRESS TEST

## 2018-02-01 LAB — CBC
HCT: 38.9 % (ref 36.0–46.0)
HEMOGLOBIN: 12.5 g/dL (ref 12.0–15.0)
MCH: 27.7 pg (ref 26.0–34.0)
MCHC: 32.1 g/dL (ref 30.0–36.0)
MCV: 86.1 fL (ref 80.0–100.0)
Platelets: 396 10*3/uL (ref 150–400)
RBC: 4.52 MIL/uL (ref 3.87–5.11)
RDW: 13.3 % (ref 11.5–15.5)
WBC: 11.9 10*3/uL — ABNORMAL HIGH (ref 4.0–10.5)
nRBC: 0 % (ref 0.0–0.2)

## 2018-02-01 LAB — TYPE AND SCREEN
ABO/RH(D): O POS
Antibody Screen: NEGATIVE

## 2018-02-01 SURGERY — Surgical Case
Anesthesia: Spinal

## 2018-02-01 MED ORDER — AMMONIA AROMATIC IN INHA
RESPIRATORY_TRACT | Status: AC
  Filled 2018-02-01: qty 10

## 2018-02-01 MED ORDER — BUPIVACAINE HCL (PF) 0.5 % IJ SOLN
INTRAMUSCULAR | Status: AC
  Filled 2018-02-01: qty 30

## 2018-02-01 MED ORDER — ACETAMINOPHEN 500 MG PO TABS: 1000.0000 mg | ORAL_TABLET | Freq: Four times a day (QID) | ORAL | Status: DC

## 2018-02-01 MED ORDER — METHYLERGONOVINE MALEATE 0.2 MG/ML IJ SOLN
INTRAMUSCULAR | Status: AC
  Filled 2018-02-01: qty 1

## 2018-02-01 MED ORDER — CEFAZOLIN SODIUM-DEXTROSE 2-4 GM/100ML-% IV SOLN
2.0000 g | Freq: Once | INTRAVENOUS | Status: DC
  Filled 2018-02-01 (×2): qty 100

## 2018-02-01 MED ORDER — PHENYLEPHRINE 8 MG IN D5W 100 ML (0.08MG/ML) PREMIX OPTIME
INJECTION | INTRAVENOUS | Status: DC | PRN
  Administered 2018-02-01: 40 ug/min via INTRAVENOUS

## 2018-02-01 MED ORDER — OXYTOCIN 40 UNITS IN NORMAL SALINE INFUSION - SIMPLE MED
INTRAVENOUS | Status: AC
  Filled 2018-02-01: qty 1000

## 2018-02-01 MED ORDER — OXYTOCIN 40 UNITS IN NORMAL SALINE INFUSION - SIMPLE MED
2.5000 [IU]/h | INTRAVENOUS | Status: AC
  Administered 2018-02-01: 2.5 [IU]/h via INTRAVENOUS

## 2018-02-01 MED ORDER — MORPHINE SULFATE (PF) 0.5 MG/ML IJ SOLN
INTRAMUSCULAR | Status: AC
  Filled 2018-02-01: qty 10

## 2018-02-01 MED ORDER — BUPIVACAINE IN DEXTROSE 0.75-8.25 % IT SOLN
INTRATHECAL | Status: DC | PRN
  Administered 2018-02-01: 1.6 mL via INTRATHECAL

## 2018-02-01 MED ORDER — ACETAMINOPHEN 325 MG PO TABS
650.0000 mg | ORAL_TABLET | ORAL | Status: DC | PRN
  Administered 2018-02-02 (×2): 650 mg via ORAL
  Filled 2018-02-01 (×2): qty 2

## 2018-02-01 MED ORDER — MEPERIDINE HCL 25 MG/ML IJ SOLN: 6.2500 mg | INTRAMUSCULAR | Status: DC | PRN

## 2018-02-01 MED ORDER — LACTATED RINGERS IV SOLN
INTRAVENOUS | Status: DC
  Administered 2018-02-01: 18:00:00 via INTRAVENOUS

## 2018-02-01 MED ORDER — LIDOCAINE HCL (PF) 1 % IJ SOLN
INTRAMUSCULAR | Status: AC
  Filled 2018-02-01: qty 30

## 2018-02-01 MED ORDER — ONDANSETRON HCL 4 MG/2ML IJ SOLN: 4.0000 mg | Freq: Once | INTRAMUSCULAR | Status: DC | PRN

## 2018-02-01 MED ORDER — PHENYLEPHRINE HCL 10 MG/ML IJ SOLN
INTRAMUSCULAR | Status: DC | PRN
  Administered 2018-02-01: 100 ug via INTRAVENOUS

## 2018-02-01 MED ORDER — OXYTOCIN 40 UNITS IN NORMAL SALINE INFUSION - SIMPLE MED
INTRAVENOUS | Status: DC | PRN
  Administered 2018-02-01: 700 mL via INTRAVENOUS

## 2018-02-01 MED ORDER — IBUPROFEN 800 MG PO TABS
800.0000 mg | ORAL_TABLET | Freq: Three times a day (TID) | ORAL | Status: AC
  Administered 2018-02-02 – 2018-02-04 (×8): 800 mg via ORAL
  Filled 2018-02-01 (×8): qty 1

## 2018-02-01 MED ORDER — KETOROLAC TROMETHAMINE 30 MG/ML IJ SOLN
30.0000 mg | Freq: Four times a day (QID) | INTRAMUSCULAR | Status: DC | PRN
  Administered 2018-02-02: 30 mg via INTRAVENOUS
  Filled 2018-02-01: qty 1

## 2018-02-01 MED ORDER — CARBOPROST TROMETHAMINE 250 MCG/ML IM SOLN
INTRAMUSCULAR | Status: AC
  Filled 2018-02-01: qty 1

## 2018-02-01 MED ORDER — MORPHINE SULFATE (PF) 0.5 MG/ML IJ SOLN
INTRAMUSCULAR | Status: DC | PRN
  Administered 2018-02-01: .1 mg via INTRATHECAL

## 2018-02-01 MED ORDER — SOD CITRATE-CITRIC ACID 500-334 MG/5ML PO SOLN
ORAL | Status: AC
  Administered 2018-02-01: 30 mL
  Filled 2018-02-01: qty 15

## 2018-02-01 MED ORDER — HYDROMORPHONE HCL 1 MG/ML IJ SOLN: 0.2000 mg | INTRAMUSCULAR | Status: DC | PRN

## 2018-02-01 MED ORDER — OXYTOCIN 10 UNIT/ML IJ SOLN
INTRAMUSCULAR | Status: AC
  Filled 2018-02-01: qty 2

## 2018-02-01 MED ORDER — KETOROLAC TROMETHAMINE 30 MG/ML IJ SOLN: 30.0000 mg | Freq: Four times a day (QID) | INTRAMUSCULAR | Status: DC | PRN

## 2018-02-01 MED ORDER — MISOPROSTOL 200 MCG PO TABS
ORAL_TABLET | ORAL | Status: AC
  Filled 2018-02-01: qty 4

## 2018-02-01 MED ORDER — BUPIVACAINE HCL 0.5 % IJ SOLN
INTRAMUSCULAR | Status: DC | PRN
  Administered 2018-02-01: 10 mL

## 2018-02-01 MED ORDER — LACTATED RINGERS IV BOLUS
1000.0000 mL | Freq: Once | INTRAVENOUS | Status: AC
  Administered 2018-02-01: 1000 mL via INTRAVENOUS

## 2018-02-01 MED ORDER — BUPIVACAINE 0.25 % ON-Q PUMP DUAL CATH 400 ML
400.0000 mL | INJECTION | Status: DC
  Filled 2018-02-01: qty 400

## 2018-02-01 MED ORDER — HYDROCODONE-ACETAMINOPHEN 5-325 MG PO TABS
1.0000 | ORAL_TABLET | ORAL | Status: DC | PRN
  Administered 2018-02-03: 2 via ORAL
  Administered 2018-02-03: 1 via ORAL
  Administered 2018-02-03: 2 via ORAL
  Administered 2018-02-03 – 2018-02-04 (×4): 1 via ORAL
  Filled 2018-02-01 (×2): qty 1
  Filled 2018-02-01: qty 2
  Filled 2018-02-01: qty 1
  Filled 2018-02-01: qty 2
  Filled 2018-02-01: qty 1
  Filled 2018-02-01: qty 2

## 2018-02-01 SURGICAL SUPPLY — 26 items
CANISTER SUCT 3000ML PPV (MISCELLANEOUS) ×3 IMPLANT
CHLORAPREP W/TINT 26ML (MISCELLANEOUS) ×6 IMPLANT
DERMABOND ADVANCED (GAUZE/BANDAGES/DRESSINGS) ×2
DERMABOND ADVANCED .7 DNX12 (GAUZE/BANDAGES/DRESSINGS) ×1 IMPLANT
DRESSING SURGICEL FIBRLLR 1X2 (HEMOSTASIS) IMPLANT
DRSG OPSITE POSTOP 4X10 (GAUZE/BANDAGES/DRESSINGS) ×1 IMPLANT
DRSG OPSITE POSTOP 4X8 (GAUZE/BANDAGES/DRESSINGS) ×2 IMPLANT
DRSG SURGICEL FIBRILLAR 1X2 (HEMOSTASIS) ×3
ELECT CAUTERY BLADE 6.4 (BLADE) ×3 IMPLANT
ELECT REM PT RETURN 9FT ADLT (ELECTROSURGICAL) ×3
ELECTRODE REM PT RTRN 9FT ADLT (ELECTROSURGICAL) ×1 IMPLANT
GLOVE BIOGEL PI IND STRL 6.5 (GLOVE) ×1 IMPLANT
GLOVE BIOGEL PI INDICATOR 6.5 (GLOVE) ×2
GOWN STRL REUS W/ TWL LRG LVL3 (GOWN DISPOSABLE) ×1 IMPLANT
GOWN STRL REUS W/ TWL XL LVL3 (GOWN DISPOSABLE) ×2 IMPLANT
GOWN STRL REUS W/TWL LRG LVL3 (GOWN DISPOSABLE) ×2
GOWN STRL REUS W/TWL XL LVL3 (GOWN DISPOSABLE) ×4
NS IRRIG 1000ML POUR BTL (IV SOLUTION) ×3 IMPLANT
PACK C SECTION AR (MISCELLANEOUS) ×3 IMPLANT
PAD OB MATERNITY 4.3X12.25 (PERSONAL CARE ITEMS) ×3 IMPLANT
PAD PREP 24X41 OB/GYN DISP (PERSONAL CARE ITEMS) ×3 IMPLANT
SUT MNCRL AB 4-0 PS2 18 (SUTURE) ×3 IMPLANT
SUT PLAIN 3-0 (SUTURE) ×3 IMPLANT
SUT VIC AB 0 CT1 36 (SUTURE) ×11 IMPLANT
SUT VIC AB 2-0 CT1 36 (SUTURE) ×3 IMPLANT
SYR 30ML LL (SYRINGE) ×6 IMPLANT

## 2018-02-01 NOTE — Anesthesia Post-op Follow-up Note (Signed)
Anesthesia QCDR form completed.        

## 2018-02-01 NOTE — Progress Notes (Signed)
ROB/NST No concerns   

## 2018-02-01 NOTE — Op Note (Signed)
Cesarean Section Procedure Note 02/01/18  Pre-operative Diagnosis: 1. Fetal Arrhythmia, non reassuring fetal status Post-operative Diagnosis: same, delivered. Procedure: Primary Low Transverse Cesarean Section Surgeon: Adelene Idler MD   Anesthesia: Spinal Estimated Blood Loss: 928 cc Complications: None; patient tolerated the procedure well.   Disposition: PACU - hemodynamically stable. Condition: stable   Findings: A female infant in the cephalic presentation. Amniotic fluid - clear   Birth weight: 6 lbs 15oz Apgars of 7 and 8.  Intact placenta with a three-vessel cord. Grossly normal uterus, tubes and ovaries bilaterally. No intraabdominal adhesions were noted. Nuchal cord tight x1.    Procedure Details    The patient was taken to operating room, identified as the correct patient and the procedure verified as C-Section Delivery. A time out was held and the above information confirmed. After induction of anesthesia, the patient was draped and prepped in the usual sterile manner. A Pfannenstiel incision was made and carried down through the subcutaneous tissue to the fascia. Fascial incision was made and extended transversely with the Mayo scissors. The fascia was separated from the underlying rectus tissue superiorly and inferiorly. The peritoneum was identified and entered bluntly. Peritoneal incision was extended longitudinally. A low transverse hysterotomy was made. The fetus was delivered atraumatically. The umbilical cord was clamped x2 and cut and the infant was handed to the awaiting pediatricians. A segment for cord gases was obtained and drawn by nursing, but the sample was not sufficient per lab. The placenta was removed intact and appeared normal with a 3-vessel cord. The uterus was exteriorized and cleared of all clot and debris. The hysterotomy was closed with running sutures of 0 Vicryl suture. A second imbricating layer was placed with the same suture. Two figure of eight  sutures were placed in the midline and in the right corner. Excellent hemostasis was observed. The uterus was returned to the abdomen. The pelvis was irrigated and again, excellent hemostasis was noted. The peritoneum was closed with a running stitch of 2-0 Vicryl. The On Q Pain pump System was then placed.  Trocars were placed through the abdominal wall into the subfascial space and these were used to thread the silver soaker cathaters into place.The rectus muscles were inspected and were hemostatic. The rectus fascia was then reapproximated with running sutures of 0-vicryl, with careful placement not to incorporate the cathaters. Subcutaneous tissues are then irrigated with saline and hemostasis assured with the bovie. The subcutaneous fat was approximated with 3-0 plain and a running stitch.  Skin was then closed with 4-0 monocryl suture in a subcuticular fashion followed by skin adhesive. The cathaters are flushed each with 5 mL of Bupivicaine and stabilized into place with dressing. Instrument, sponge, and needle counts were correct prior to the abdominal closure and at the conclusion of the case.  The patient tolerated the procedure well and was transferred to the recovery room in stable condition.   Natale Milch MD [facility], [facility] 02/01/18 9:45 PM

## 2018-02-01 NOTE — Consult Note (Signed)
[facility] Anesthesia Consultation  Eileen Parsons JEH:[id number] DOB: 12/04/1990 DOA: 02/01/2018 PCP: Patient, No Pcp Per   Requesting physician: Dr. Bonney Aid Date of consultation: 02/01/18  Reason for consultation: Obesity during pregnancy  CHIEF COMPLAINT:  Obesity during pregnancy  HISTORY OF PRESENT ILLNESS: Eileen Parsons  is a 28 y.o. female with a known history of obesity and prior vaginal delivery with shoulder dystocia. Recently admitted for evaluation of fetal arrhythmia. Denies hx of cardiac disease, denies hx of asthma, denies personal or family hx of bleeding disorders. Planning for vaginal delivery, had epidural with previous delivery and planning on epidural for labor analgesia with this delivery as well.   PAST MEDICAL HISTORY:   Past Medical History:  Diagnosis Date  . History of shoulder dystocia in prior pregnancy    with first pregnancy  . Medical history non-contributory   . Obesity    BMI=40.25 kb/m2    PAST SURGICAL HISTORY:  Past Surgical History:  Procedure Laterality Date  . MOUTH SURGERY      SOCIAL HISTORY:  Social History   Tobacco Use  . Smoking status: Never Smoker  . Smokeless tobacco: Never Used  Substance Use Topics  . Alcohol use: Yes    Comment: weekends-stopped alcohol with pregnancy    FAMILY HISTORY:  Family History  Problem Relation Age of Onset  . Alcohol abuse Mother   . Diabetes Mellitus II Maternal Aunt   . Diabetes Mellitus II Paternal Aunt   . Diabetes Mellitus II Paternal Uncle   . Breast cancer Neg Hx   . Ovarian cancer Neg Hx   . Colon cancer Neg Hx     DRUG ALLERGIES: No Known Allergies  REVIEW OF SYSTEMS:   RESPIRATORY: No cough, shortness of breath, wheezing.  CARDIOVASCULAR: No chest pain, orthopnea, edema.  HEMATOLOGY: No anemia, easy bruising or bleeding SKIN: No rash or lesion. NEUROLOGIC: No tingling, numbness, weakness.  PSYCHIATRY: No anxiety or depression.    MEDICATIONS AT HOME:  Prior to Admission medications   Medication Sig Start Date End Date Taking? Authorizing Provider  folic acid (FOLVITE) 1 MG tablet Take 1 tablet (1 mg total) by mouth daily. 07/24/17   Eileen Parsons, Eileen Parsons  Prenatal Vit-DSS-Fe Fum-FA (PRENATAL 19) tablet Take 1 tablet by mouth daily. 04/27/17   Eileen Filbert, Eileen Parsons  prochlorperazine (COMPAZINE) 10 MG tablet Take 1 tablet (10 mg total) by mouth every 6 (six) hours as needed for nausea (headaches). 10/02/17   Eileen Austria, Eileen Parsons      PHYSICAL EXAMINATION:   VITAL SIGNS: Blood pressure 126/61, pulse 91, temperature 37.1 C, temperature source Oral, height 5\' 7"  (1.702 m), weight 128.1 kg, last menstrual period 05/02/2017, SpO2 100 %.  GENERAL:  28 y.o.-year-old patient no acute distress.  HEENT: Head atraumatic, normocephalic. Oropharynx and nasopharynx clear. MP 1, TM distance >3 cm, normal mouth opening, grade 2 upper lip bite LUNGS: Normal breath sounds bilaterally, no wheezing, rales,rhonchi. No use of accessory muscles of respiration.  CARDIOVASCULAR: S1, S2 normal. No murmurs, rubs, or gallops.  EXTREMITIES: No pedal edema, cyanosis, or clubbing.  NEUROLOGIC: normal gait PSYCHIATRIC: The patient is alert and oriented x 3.  SKIN: No obvious rash, lesion, or ulcer.    IMPRESSION AND PLAN:   Eileen Parsons  is a 28 y.o. female presenting with obesity during pregnancy. BMI is currently 44 at [redacted] weeks gestation.   Reassuring airway exam, no predictors for difficult airway.   We discussed analgesic options during labor including epidural analgesia.  Discussed that in obesity there can be increased difficulty with epidural placement or even failure of successful epidural. We also discussed that even after successful epidural placement there is increased risk of catheter migration out of the epidural space that would require catheter replacement. Discussed use of epidural vs spinal vs GA if cesarean delivery is  required. Discussed increased risk of difficult intubation during pregnancy should an emergency cesarean delivery be required.   Plan for delivery at [facility].

## 2018-02-01 NOTE — Transfer of Care (Signed)
Immediate Anesthesia Transfer of Care Note  Patient: Eileen Parsons  Procedure(s) Performed: CESAREAN SECTION (N/A )  Patient Location: PACU  Anesthesia Type:Spinal  Level of Consciousness: awake, alert  and oriented  Airway & Oxygen Therapy: Patient Spontanous Breathing  Post-op Assessment: Report given to RN and Post -op Vital signs reviewed and stable  Post vital signs: Reviewed  Last Vitals:  Vitals Value Taken Time  BP 116/81 02/01/2018  9:42 PM  Temp 36.7 C 02/01/2018  9:42 PM  Pulse 81 02/01/2018  9:42 PM  Resp 16 02/01/2018  9:42 PM  SpO2 100 % 02/01/2018  9:42 PM    Last Pain: There were no vitals filed for this visit.       Complications: No apparent anesthesia complications

## 2018-02-01 NOTE — Anesthesia Preprocedure Evaluation (Signed)
Anesthesia Evaluation  Patient identified by MRN, date of birth, ID band Patient awake    Reviewed: Allergy & Precautions, NPO status , Patient's Chart, lab work & pertinent test results  History of Anesthesia Complications Negative for: history of anesthetic complications  Airway Mallampati: III       Dental   Pulmonary neg sleep apnea, neg COPD,           Cardiovascular (-) hypertension(-) Past MI and (-) CHF (-) dysrhythmias (-) Valvular Problems/Murmurs     Neuro/Psych neg Seizures    GI/Hepatic Neg liver ROS, GERD (with pregnncy)  ,  Endo/Other  neg diabetesMorbid obesity  Renal/GU negative Renal ROS     Musculoskeletal   Abdominal   Peds  Hematology   Anesthesia Other Findings   Reproductive/Obstetrics                             Anesthesia Physical Anesthesia Plan  ASA: III and emergent  Anesthesia Plan: Spinal   Post-op Pain Management:    Induction:   PONV Risk Score and Plan:   Airway Management Planned: Nasal Cannula  Additional Equipment:   Intra-op Plan:   Post-operative Plan:   Informed Consent: I have reviewed the patients History and Physical, chart, labs and discussed the procedure including the risks, benefits and alternatives for the proposed anesthesia with the patient or authorized representative who has indicated his/her understanding and acceptance.       Plan Discussed with:   Anesthesia Plan Comments:         Anesthesia Quick Evaluation

## 2018-02-01 NOTE — H&P (Signed)
History and Physical  Eileen Parsons is an 28 y.o. female.  HPI: Patient presents to triage from office for a NST. They were having difficulty tracing in the office. She was seen earlier today by MFM for an [place]. MFM reported that she did not have a fetal arrhythmia today on [place]. She was previously hospitalized on the 13 th for fetal arrhyhtmia. It was suspected that she had PACs although a fetal ECHO has not been performed. She was discharged with a plan of twice weekly BPPs and NSTs. She is feeling well and reports good fetal movement.   Her pregnancy has been complicated by maternal obesity and history of shoulder dystocia.   In the room the fetal heart rate stays in the 80s for extended periods of time ranging from 5 seconds to 15 seconds with intermittent and brief returns to a baseline of 150s.  Tracing is broken and difficult to record.    Pregnancy#4 Problems (from 07/24/17 to present)    Problem Noted Resolved   Supervision of high risk pregnancy, antepartum 07/26/2017 by [name], CNM No   Overview Addendum 02/01/2018  6:24 PM by Natale Milch, MD    Clinic [facility] Prenatal Labs  Dating 7wk3d Korea Blood type: O/Positive/-- (07/08 1449)   Genetic Screen AFP:  neg   NIPS: diploid XX Antibody:Negative (07/08 1449)  Anatomic [place] Incomplete female [ ]  complete 24 weeks Rubella: 1.20 (07/08 1449) Varicella: Immune  GTT Early: 82 Third trimester: 85 RPR: Non Reactive (07/08 1449)   Rhogam N/A HBsAg: Negative (07/08 1449)   TDaP vaccine        12/11/17                Flu Shot: HIV: Non Reactive (07/08 1449)   Baby Food     Breast/ bottle                           GBS: Negative  Contraception Discussed options 12/23 Pap: 05/11/17 NIL at [facility]  CBB     CS/VBAC    Support Person           History of shoulder dystocia in prior pregnancy, currently pregnant 07/26/2017 by [name], CNM No   Overview Addendum 10/02/2017 11:03 AM by Vena Austria, MD    Review of [facility]  records: 90 sec shoulder dystocia/ SVD Discussed risks of shoulder dystocia and offer CS for delivery Pelvis tested to 8lbs 11oz [ ]  36 week growth            Past Medical History:  Diagnosis Date  . History of shoulder dystocia in prior pregnancy    with first pregnancy  . Medical history non-contributory   . Obesity    BMI=40.25 kb/m2    Past Surgical History:  Procedure Laterality Date  . MOUTH SURGERY      Family History  Problem Relation Age of Onset  . Alcohol abuse Mother   . Diabetes Mellitus II Paternal Aunt   . Diabetes Mellitus II Paternal Uncle   . Lupus Maternal Grandmother   . Kidney failure Paternal Grandfather   . Breast cancer Neg Hx   . Ovarian cancer Neg Hx   . Colon cancer Neg Hx     Social History:  reports that she has never smoked. She has never used smokeless tobacco. She reports previous alcohol use. She reports that she does not use drugs.  Allergies: No Known Allergies  Medications: I have reviewed  the patient's current medications.  No results found for this or any previous visit (from the past 48 hour(s)).  [place] Mfm Ob Limited  Result Date: 02/01/2018 ----------------------------------------------------------------------  OBSTETRICS REPORT                       (Signed Final 02/01/2018 12:18 pm) ---------------------------------------------------------------------- PATIENT INFO:  ID #:       [id number]                          D.O.B.:  1990-05-10 (27 yrs)  Name:       Eileen Parsons                    Visit Date: 02/01/2018 12:05 pm ---------------------------------------------------------------------- PERFORMED BY:  Performed By:     Ceasar Mons          Ref. Address:     [address]                                                             [place], [place],                                                             [place] [address]  Referred By:      Nadara Mustard MD  ---------------------------------------------------------------------- SERVICE(S) PROVIDED:   [place] MFM OB LIMITED                                    [phone]  ---------------------------------------------------------------------- INDICATIONS:   [redacted] weeks gestation of pregnancy                Z3A.37  ---------------------------------------------------------------------- FETAL EVALUATION:  Num Of Fetuses:         1  Fetal Heart Rate(bpm):  140  Cardiac Activity:       Present  Presentation:           Cephalic  Placenta:               Posterior  AFI Sum(cm)     %Tile       Largest Pocket(cm)  13.49           51          4.61  RUQ(cm)       RLQ(cm)       LUQ(cm)        LLQ(cm)  2.92          2.42          4.61           3.54 ---------------------------------------------------------------------- BIOPHYSICAL EVALUATION:  Amniotic F.V:   Within normal limits       F. Tone:        Observed  F.  Movement:    Observed                   Score:          8/8  F. Breathing:   Observed ---------------------------------------------------------------------- GESTATIONAL AGE:  Best:          37w 4d     Det. ByMarcella Dubs:  Early Ultrasound         EDD:   02/18/18                                      (07/05/17) ---------------------------------------------------------------------- ANATOMY:  Stomach:               Seen                   Bladder:                Seen ---------------------------------------------------------------------- IMPRESSION:  Dear. Dr Tiburcio PeaHarris  Thank you for referring your patient for antental testing/BPP  due to FHR arrthymia noted on NST performed last week due  to maternal obesity.  Please see the previous noted by Dr.  Leatha GildingLivingston for additional information.  The patient was noted  to have intermittent PACs last week on m-mode and fetal  echocardiogram (or neonatal) was recommended and she  was advised to eliminate dietary caffeine/stimulants.  BPP today  is 8/8  No fetal arrthymia noted.  Findings were reviewed.  ----------------------------------------------------------------------                   Consuelo PandyMaria Small, MD Electronically Signed Final Report   02/01/2018 12:18 pm ----------------------------------------------------------------------   Review of Systems  Constitutional: Negative for chills, fever, malaise/fatigue and weight loss.  HENT: Negative for congestion, hearing loss and sinus pain.   Eyes: Negative for blurred vision and double vision.  Respiratory: Negative for cough, sputum production, shortness of breath and wheezing.   Cardiovascular: Negative for chest pain, palpitations, orthopnea and leg swelling.  Gastrointestinal: Negative for abdominal pain, constipation, diarrhea, nausea and vomiting.  Genitourinary: Negative for dysuria, flank pain, frequency, hematuria and urgency.  Musculoskeletal: Negative for back pain, falls and joint pain.  Skin: Negative for itching and rash.  Neurological: Negative for dizziness and headaches.  Psychiatric/Behavioral: Negative for depression, substance abuse and suicidal ideas. The patient is not nervous/anxious.    Last menstrual period 05/02/2017. Physical Exam  Nursing note and vitals reviewed. Constitutional: She is oriented to person, place, and time. She appears well-developed and well-nourished.  HENT:  Head: Normocephalic and atraumatic.  Cardiovascular: Normal rate and regular rhythm.  Respiratory: Effort normal and breath sounds normal.  GI: Soft.  Musculoskeletal: Normal range of motion.  Neurological: She is alert and oriented to person, place, and time.  Skin: Skin is warm and dry.  Psychiatric: She has a normal mood and affect. Her behavior is normal. Judgment and thought content normal.    Assessment/Plan: 28 yo with a fetal arrhythmia 1. CBC and type and screen 2. Fetal montioring - In the room the fetal heart rate stays in the 80s for extended periods of time ranging from 5 seconds to 15 seconds with intermittent and brief  returns to a baseline 81/- of 150s.  Tracing is broken and difficult to record.  Discussed this with Dr. Leander Ramshee. Appears to be significantly changed fro her last tracing on the 13th.  IVF started.   18:01 Discussed with Dr.  Leander Rams from [facility] about transfer  18:35 Dr. Leander Rams accepted transfer 18:39-18:45 Nurse called report to [facility] Nurse   18:48 [facility] transfer service called, will return call with time they can arrive. 19:27 Called Dr. Leander Rams, she listened to the heart tones. Tones seem to be worsening with extended fetal heart rate in the 80s-50s. She recommended delivery instead of transfer. Urgent cesarean section called and neonatologist notified to come to the hospital for the delivery.   Catlyn Shipton R Saxon Crosby 02/01/2018, 6:17 PM

## 2018-02-01 NOTE — Progress Notes (Signed)
    Routine Prenatal Care Visit  Subjective  Eileen Parsons is a 28 y.o. G4P1021 at 2422w4d being seen today for ongoing prenatal care.  She is currently monitored for the following issues for this high-risk pregnancy and has Supervision of high risk pregnancy, antepartum; Obesity in pregnancy; History of shoulder dystocia in prior pregnancy, currently pregnant; and Indication for care in labor and delivery, antepartum on their problem list.  ----------------------------------------------------------------------------------- Patient reports no complaints.   Contractions: Irregular. Vag. Bleeding: None.  Movement: Present. No leaking of fluid.  ----------------------------------------------------------------------------------- The following portions of the patient's history were reviewed and updated as appropriate: allergies, current medications, past family history, past medical history, past social history, past surgical history and problem list. Problem list updated.  Objective  Blood pressure 122/80, weight 282 lb (127.9 kg), last menstrual period 05/02/2017. Pregravid weight 250 lb (113.4 kg) Total Weight Gain 32 lb (14.5 kg) Body mass index is 44.17 kg/m.   Fetal Status: Fetal Heart Rate (bpm): 146   Movement: Present     General:  Alert, oriented and cooperative. Patient is in no acute distress.  Skin: Skin is warm and dry. No rash noted.   Cardiovascular: Normal heart rate noted  Respiratory: Normal respiratory effort, no problems with respiration noted  Abdomen: Soft, gravid, appropriate for gestational age. Pain/Pressure: Present     Pelvic:  Cervical exam deferred        Extremities: Normal range of motion.     Mental Status: Normal mood and affect. Normal behavior. Normal judgment and thought content.     Assessment   28 y.o. [id number] at 4722w4d, EDD 02/18/2018 Alternate EDD Entry presenting for a routine prenatal visit.  Plan   Pregnancy#4 Problems (from 07/24/17 to present)      Problem Noted Resolved   History of shoulder dystocia in prior pregnancy, currently pregnant 07/26/2017 by Farrel ConnersGutierrez, Colleen, CNM No   Overview Addendum 10/02/2017 11:03 AM by Vena AustriaStaebler, Andreas, MD    Review of [facility] records: 90 sec shoulder dystocia/ SVD Discussed risks of shoulder dystocia and offer CS for delivery Pelvis tested to 8lbs 11oz [ ]  36 week growth        NST today inconclusive, known fetal arrythmia makes strip difficult to interpret. Able to hear audible accelerations but they are not tracing on strip. Good fetal movement. After consultation with Dr. Tiburcio PeaHarris, asked patient to go to Labor and Delivery for monitoring. [facility]  Consider NST on Mondays in Labor and Delivery [facility] and ROB/NST in office on Thursdays versus biweekly office NST.  Return in about 4 days (around 02/05/2018) for ROB and NST.  Marcelyn BruinsJacelyn Brodan Parsons, CNM 02/01/2018  4:56 PM

## 2018-02-01 NOTE — OB Triage Note (Signed)
Patient sent from [facility] office due to non reactive NST in the office.

## 2018-02-02 ENCOUNTER — Encounter: Payer: Self-pay | Admitting: Obstetrics and Gynecology

## 2018-02-02 LAB — CBC
HCT: 35.4 % — ABNORMAL LOW (ref 36.0–46.0)
Hemoglobin: 11.5 g/dL — ABNORMAL LOW (ref 12.0–15.0)
MCH: 28.2 pg (ref 26.0–34.0)
MCHC: 32.5 g/dL (ref 30.0–36.0)
MCV: 86.8 fL (ref 80.0–100.0)
Platelets: 308 10*3/uL (ref 150–400)
RBC: 4.08 MIL/uL (ref 3.87–5.11)
RDW: 13.3 % (ref 11.5–15.5)
WBC: 13 10*3/uL — ABNORMAL HIGH (ref 4.0–10.5)
nRBC: 0 % (ref 0.0–0.2)

## 2018-02-02 MED ORDER — DIPHENHYDRAMINE HCL 25 MG PO CAPS: 25.0000 mg | ORAL_CAPSULE | Freq: Four times a day (QID) | ORAL | Status: DC | PRN

## 2018-02-02 MED ORDER — FLEET ENEMA 7-19 GM/118ML RE ENEM: 1.0000 | ENEMA | Freq: Every day | RECTAL | Status: DC | PRN

## 2018-02-02 MED ORDER — FERROUS SULFATE 325 (65 FE) MG PO TABS
325.0000 mg | ORAL_TABLET | Freq: Two times a day (BID) | ORAL | Status: DC
  Administered 2018-02-02 – 2018-02-04 (×5): 325 mg via ORAL
  Filled 2018-02-02 (×5): qty 1

## 2018-02-02 MED ORDER — MENTHOL 3 MG MT LOZG
1.0000 | LOZENGE | OROMUCOSAL | Status: DC | PRN
  Filled 2018-02-02: qty 9

## 2018-02-02 MED ORDER — LACTATED RINGERS IV SOLN: INTRAVENOUS | Status: DC

## 2018-02-02 MED ORDER — SENNOSIDES-DOCUSATE SODIUM 8.6-50 MG PO TABS
2.0000 | ORAL_TABLET | ORAL | Status: DC
  Administered 2018-02-02 – 2018-02-04 (×3): 2 via ORAL
  Filled 2018-02-02 (×3): qty 2

## 2018-02-02 MED ORDER — BISACODYL 10 MG RE SUPP
10.0000 mg | Freq: Every day | RECTAL | Status: DC | PRN
  Administered 2018-02-03: 10 mg via RECTAL
  Filled 2018-02-02: qty 1

## 2018-02-02 MED ORDER — SIMETHICONE 80 MG PO CHEW
80.0000 mg | CHEWABLE_TABLET | Freq: Three times a day (TID) | ORAL | Status: DC
  Administered 2018-02-02 – 2018-02-04 (×8): 80 mg via ORAL
  Filled 2018-02-02 (×5): qty 1

## 2018-02-02 MED ORDER — COCONUT OIL OIL
1.0000 "application " | TOPICAL_OIL | Status: DC | PRN
  Administered 2018-02-04: 1 via TOPICAL
  Filled 2018-02-02: qty 120

## 2018-02-02 MED ORDER — PRENATAL MULTIVITAMIN CH
1.0000 | ORAL_TABLET | Freq: Every day | ORAL | Status: DC
  Administered 2018-02-02 – 2018-02-04 (×3): 1 via ORAL
  Filled 2018-02-02 (×3): qty 1

## 2018-02-02 MED ORDER — SIMETHICONE 80 MG PO CHEW
80.0000 mg | CHEWABLE_TABLET | ORAL | Status: DC
  Filled 2018-02-02 (×3): qty 1

## 2018-02-02 MED ORDER — ZOLPIDEM TARTRATE 5 MG PO TABS: 5.0000 mg | ORAL_TABLET | Freq: Every evening | ORAL | Status: DC | PRN

## 2018-02-02 MED ORDER — WITCH HAZEL-GLYCERIN EX PADS: 1.0000 "application " | MEDICATED_PAD | CUTANEOUS | Status: DC | PRN

## 2018-02-02 MED ORDER — DIBUCAINE 1 % RE OINT: 1.0000 "application " | TOPICAL_OINTMENT | RECTAL | Status: DC | PRN

## 2018-02-02 MED ORDER — SIMETHICONE 80 MG PO CHEW
80.0000 mg | CHEWABLE_TABLET | ORAL | Status: DC | PRN
  Administered 2018-02-03: 80 mg via ORAL
  Filled 2018-02-02: qty 1

## 2018-02-02 NOTE — Progress Notes (Signed)
   02/02/18 1700  Clinical Encounter Type  Visited With Patient and family together  Visit Type Follow-up  Referral From Nurse  Pt declined education on AD. Ch left a copy with the patient.

## 2018-02-02 NOTE — Progress Notes (Signed)
  Subjective:   Patient is tolerating PO intake and her pain is controlled with PO pain medicine and On Q pump. She has not yet been out of bed to ambulate. Foley catheter is in place. She has not put the baby to breast yet since baby is in the special care nursery but she plans to both breast and formula feed.   Objective:  Blood pressure 133/78, pulse 74, temperature 98.2 F (36.8 C), temperature source Oral, resp. rate 18, height 5\' 6"  (1.676 m), weight 127.9 kg, last menstrual period 05/02/2017, SpO2 100 %  General: NAD Pulmonary: no increased work of breathing Abdomen: non-distended, non-tender, fundus firm at level of umbilicus Incision: honeycomb dressing is C/D/I, On Q pump intact Extremities: no edema, no erythema, no tenderness  Results for orders placed or performed during the hospital encounter of 02/01/18 (from the past 24 hour(s))  CBC     Status: Abnormal   Collection Time: 02/01/18  6:22 PM  Result Value Ref Range   WBC 11.9 (H) 4.0 - 10.5 K/uL   RBC 4.52 3.87 - 5.11 MIL/uL   Hemoglobin 12.5 12.0 - 15.0 g/dL   HCT 19.1 47.8 - 29.5 %   MCV 86.1 80.0 - 100.0 fL   MCH 27.7 26.0 - 34.0 pg   MCHC 32.1 30.0 - 36.0 g/dL   RDW 62.1 30.8 - 65.7 %   Platelets 396 150 - 400 K/uL   nRBC 0.0 0.0 - 0.2 %  Type and screen [facility]     Status: None   Collection Time: 02/01/18  6:22 PM  Result Value Ref Range   ABO/RH(D) O POS    Antibody Screen NEG    Sample Expiration      02/04/2018 Performed at [facility], [address] [place], [place] [address]   CBC     Status: Abnormal   Collection Time: 02/02/18  6:18 AM  Result Value Ref Range   WBC 13.0 (H) 4.0 - 10.5 K/uL   RBC 4.08 3.87 - 5.11 MIL/uL   Hemoglobin 11.5 (L) 12.0 - 15.0 g/dL   HCT 29.5 (L) 28.4 - 13.2 %   MCV 86.8 80.0 - 100.0 fL   MCH 28.2 26.0 - 34.0 pg   MCHC 32.5 30.0 - 36.0 g/dL   RDW 44.0 10.2 - 72.5 %   Platelets 308 150 - 400 K/uL   nRBC 0.0 0.0 - 0.2 %     Intake/Output Summary (Last 24 hours) at 02/02/2018 0934 Last data filed at 02/02/2018 0443 Gross per 24 hour  Intake 943.75 ml  Output 1618 ml  Net -674.25 ml      Assessment:   28 y.o. [id number] postoperativeday # 1   Plan:  1) Acute blood loss anemia - hemodynamically stable and asymptomatic - po ferrous sulfate  2) O positive, Rubella Immune, Varicella Immune  3) TDAP status: given antepartum  4) Breast/Formula/Contraception: pills  5) Disposition: continue routine post c/section care   Tresea Mall, CNM

## 2018-02-02 NOTE — Progress Notes (Signed)
   02/02/18 1120  Clinical Encounter Type  Visited With Patient and family together;Health care provider  Visit Type Initial (order for AD)  Referral From Nurse   Chaplain attempted to follow up on order for AD.  Patient preparing to visit child in nursery, would prefer chaplain return later today.

## 2018-02-02 NOTE — Anesthesia Postprocedure Evaluation (Signed)
Anesthesia Post Note  Patient: Eileen Parsons  Procedure(s) Performed: CESAREAN SECTION (N/A )  Patient location during evaluation: Women's Unit Anesthesia Type: Spinal Level of consciousness: awake, awake and alert and oriented Pain management: pain level controlled Vital Signs Assessment: post-procedure vital signs reviewed and stable Respiratory status: spontaneous breathing Cardiovascular status: blood pressure returned to baseline Postop Assessment: no headache Anesthetic complications: no     Last Vitals:  Vitals:   02/02/18 0600 02/02/18 0630  BP:  117/76  Pulse:  84  Resp:  18  Temp:  36.7 C  SpO2: 99% 99%    Last Pain:  Vitals:   02/02/18 0630  TempSrc: Oral  PainSc:                  Eileen Parsons

## 2018-02-02 NOTE — Discharge Summary (Signed)
OB Discharge Summary     Patient Name: Eileen Parsons DOB: February 19, 1990 MRN: [id number]  Date of admission: 02/01/2018 Delivering MD: Farrel Connersolleen Karry Barrilleaux, CNM  Date of Delivery: 02/01/2018  Date of discharge: 02/04/2018 Admitting diagnosis: [redacted] wks pregnant/ Fetal arrhythmia Intrauterine pregnancy: 3929w4d     Secondary diagnosis: none     Discharge diagnosis: Term Pregnancy Delivered, low transverse Cesarean section for non reassuring fetal  status                     Hospital course:  Sceduled C/S   28 y.o. yo [id number] at 2529w4d was admitted to the hospital 02/01/2018 for fetal monitoring. Fetal arrhythmia was detected. MFM was consulted and they advised delivery. She was delivered by cesarean section. Membrane Rupture Time/Date:   ,    Patient delivered a Viable infant.02/01/2018  Details of operation can be found in separate operative note.  Pateint had an uncomplicated postpartum course.  She is ambulating, tolerating a regular diet, passing flatus, and urinating well. Patient is discharged home in stable condition on 02/04/2018                                                                       Post partum procedures:none  Complications: None  Physical exam on 02/04/2018: Vitals:   02/03/18 1512 02/03/18 1952 02/03/18 2319 02/04/18 0833  BP: 132/76 (!) 133/94 128/81 123/70  Pulse: 90 95 (!) 108 87  Resp: 18 18 20 18   Temp: 98.9 F (37.2 C) 98 F (36.7 C) 98.2 F (36.8 C) 98.6 F (37 C)  TempSrc: Oral Oral Oral Oral  SpO2: 100% 100% 100% 100%  Weight:      Height:       General: alert, cooperative and no distress . Passing flatus. Had a BM yesterday Heart: RRR without murmur Lungs: CTAB/ normal respiratory effort Lochia: appropriate Abdomen: soft, non distended, bowel sounds active Incision: Dressing is clean, dry, and intact, ON Q intact DVT Evaluation: No evidence of DVT seen on physical exam.  Labs: Lab Results  Component Value Date   WBC 13.0 (H) 02/02/2018   HGB 11.5  (L) 02/02/2018   HCT 35.4 (L) 02/02/2018   MCV 86.8 02/02/2018   PLT 308 02/02/2018   CMP Latest Ref Rng & Units 07/24/2017  Glucose 65 - 99 mg/dL 74  BUN 6 - 20 mg/dL 9  Creatinine 1.470.57 - 8.291.00 mg/dL 5.620.58  Sodium 130134 - 865144 mmol/L 137  Potassium 3.5 - 5.2 mmol/L 4.4  Chloride 96 - 106 mmol/L 103  CO2 20 - 29 mmol/L 19(L)  Calcium 8.7 - 10.2 mg/dL 9.4  Total Protein 6.0 - 8.5 g/dL 7.5  Total Bilirubin 0.0 - 1.2 mg/dL 0.3  Alkaline Phos 39 - 117 IU/L 65  AST 0 - 40 IU/L 11  ALT 0 - 32 IU/L 6    Discharge instruction: per After Visit Summary.  Medications:  Allergies as of 02/04/2018   No Known Allergies     Medication List    STOP taking these medications   folic acid 1 MG tablet Commonly known as:  FOLVITE     TAKE these medications   HYDROcodone-acetaminophen 5-325 MG tablet Commonly known as:  NORCO/VICODIN Take 1-2 tablets by mouth  every 6 (six) hours as needed for up to 7 days for moderate pain.   ibuprofen 600 MG tablet Commonly known as:  ADVIL,MOTRIN Take 1 tablet (600 mg total) by mouth every 6 (six) hours as needed for headache, mild pain, moderate pain or cramping.   norethindrone 0.35 MG tablet Commonly known as:  MICRONOR,CAMILA,ERRIN Take 1 tablet (0.35 mg total) by mouth daily. Start taking on:  March 04, 2018   PRENATAL 19 tablet Take 1 tablet by mouth daily.       Diet: routine diet  Activity: Advance as tolerated. Pelvic rest for 6 weeks.   Outpatient follow up: Follow-up Information    Schuman, Jaquelyn Bitter, MD In 1 week.   Specialty:  Obstetrics and Gynecology Contact information: [address] [place] [address] [phone]        [facility], [place]. Schedule an appointment as soon as possible for a visit.   Why:  for ON Q removal for Tuesday Contact information: [address] [place] [address] [phone]             Postpartum contraception: Progesterone only pills Rhogam Given  postpartum: no Rubella vaccine given postpartum: no Varicella vaccine given postpartum: no TDaP given antepartum or postpartum: Yes , 12/11/17 Newborn Data: Live born female [name] Birth Weight: 6 lb 15.1 oz (3150 g) APGAR: 7, 9  Newborn Delivery   Birth date/time:  02/01/2018 20:37:00 Delivery type:  C-Section, Low Transverse Trial of labor:  No C-section categorization:  Primary      Baby Feeding: Bottle and Breast Baby Name: [name] Disposition:home with mother  SIGNED:  [name], [facility] 02/04/2018 9:24 AM

## 2018-02-02 NOTE — Lactation Note (Signed)
This note was copied from a baby's chart. Lactation Consultation Note  Patient Name: Girl Mozel Vanderheiden Today's Date: 02/02/2018   Mom has been set up with a breastpump in her room with instructions but has not pumped, I encouraged her to pump every 3 hrs or at least 8 x a day.  Maternal Data    Feeding    LATCH Score                   Interventions    Lactation Tools Discussed/Used     Consult Status      Dyann Kief 02/02/2018, 8:02 PM

## 2018-02-03 NOTE — Progress Notes (Signed)
Pt. With c/o gas pain and feeling like she needs to have a B.M. but is, "Scared." Simethicone and Bisacodyl given as perm PRN order. Will cont. To follow.

## 2018-02-03 NOTE — Plan of Care (Signed)
Alert and oriented with aprop. Affect. Color good, skin w&d. BBS clear. Ambulating with slow, steady gait. Lower abdominal incision with Honeycomb dressing D&I. Fundus is Firm at U/-1 with scant Lochia. V/O of Call Don't Fall policy. States Pain relief with scheduled pain medication and PRN Pain Medication. Appears to be bonding well with Infant. Demonstrates ability to care for self.

## 2018-02-03 NOTE — Progress Notes (Signed)
POD #2 Repeat CS for non reassuring FH tracing Subjective:   Doing well. Tolerating regular diet and passing flatus. Voiding QS. Bleeding slowing. Pain managed with oral medications. Baby being discharged from the NICU-PACs have stopped.   Objective:  Blood pressure 126/73, pulse 93, temperature 98.7 F (37.1 C), temperature source Oral, resp. rate 18, height 5\' 6"  (1.676 m), weight 127.9 kg, last menstrual period 05/02/2017, SpO2 99 %, unknown if currently breastfeeding.  General: BF in NAD Pulmonary: no increased work of breathing/ CTAB Heart: RRR without murmur Abdomen: non-distended, soft, bowel sounds present x 4 Incision: Honeycomb dressing C&D&I, ON Q intact Extremities: trace pedal/ ankle edema, no erythema, no tenderness  Results for orders placed or performed during the hospital encounter of 02/01/18 (from the past 72 hour(s))  CBC     Status: Abnormal   Collection Time: 02/01/18  6:22 PM  Result Value Ref Range   WBC 11.9 (H) 4.0 - 10.5 K/uL   RBC 4.52 3.87 - 5.11 MIL/uL   Hemoglobin 12.5 12.0 - 15.0 g/dL   HCT 08.1 44.8 - 18.5 %   MCV 86.1 80.0 - 100.0 fL   MCH 27.7 26.0 - 34.0 pg   MCHC 32.1 30.0 - 36.0 g/dL   RDW 63.1 49.7 - 02.6 %   Platelets 396 150 - 400 K/uL   nRBC 0.0 0.0 - 0.2 %    Comment: Performed at [facility], [address] [place], [place] [address]  Type and screen [facility]     Status: None   Collection Time: 02/01/18  6:22 PM  Result Value Ref Range   ABO/RH(D) O POS    Antibody Screen NEG    Sample Expiration      02/04/2018 Performed at [facility], [address] [place], [place] [address]   CBC     Status: Abnormal   Collection Time: 02/02/18  6:18 AM  Result Value Ref Range   WBC 13.0 (H) 4.0 - 10.5 K/uL   RBC 4.08 3.87 - 5.11 MIL/uL   Hemoglobin 11.5 (L) 12.0 - 15.0 g/dL   HCT 74.1 (L) 28.7 - 86.7 %   MCV 86.8 80.0 - 100.0 fL   MCH 28.2 26.0 - 34.0 pg   MCHC 32.5 30.0 - 36.0 g/dL   RDW 67.2 09.4 - 70.9 %   Platelets 308 150 - 400 K/uL   nRBC 0.0 0.0 - 0.2 %    Comment: Performed at [facility], [address], [place], [place] [address]     Assessment:   28 y.o. [id number] postoperativeday # 2-stable  Continue postoperative and postpartum care  Continue ambulation   Plan:   1) O POS/ RI/ VI  2) TDAP -given11/25/2019  3) Breast  And bottle  4) Contraception: undecided  5) Probable discharge tomorrow  [name], CNM

## 2018-02-04 ENCOUNTER — Ambulatory Visit: Payer: Self-pay

## 2018-02-04 MED ORDER — IBUPROFEN 600 MG PO TABS: 600.0000 mg | ORAL_TABLET | Freq: Four times a day (QID) | ORAL | Status: DC | PRN

## 2018-02-04 MED ORDER — IBUPROFEN 600 MG PO TABS: 600.0000 mg | ORAL_TABLET | Freq: Four times a day (QID) | ORAL | 0 refills | Status: DC | PRN

## 2018-02-04 MED ORDER — HYDROCODONE-ACETAMINOPHEN 5-325 MG PO TABS: 1.0000 | ORAL_TABLET | Freq: Four times a day (QID) | ORAL | 0 refills | Status: AC | PRN

## 2018-02-04 MED ORDER — NORETHINDRONE 0.35 MG PO TABS: 1.0000 | ORAL_TABLET | Freq: Every day | ORAL | 11 refills | Status: DC

## 2018-02-04 NOTE — Discharge Instructions (Signed)
Breast Pumping Tips There may be times when you cannot feed your baby from your breast, such as when you are at work or on a trip. Breast pumping allows you to remove milk from your breast in order to store for later use. There are three ways to pump. You can use:  Your hand to massage and squeeze your breast (hand expression).  A handheld manual pump.  An electric pump. When you first start to pump, you may not get much milk, but after a few days your breasts should start to make more. Pumping can help stimulate your milk supply after your baby is born. It can also help maintain your milk supply when you are away from your baby. When should I pump? You can start pumping soon after your baby is born. Here are some tips on when to pump:  When with your baby: ? Pump after breastfeeding. ? Pump from the free breast while you breastfeed.  When away from your baby: ? Pump every 2-3 hours for about 15 minutes. ? Pump both breasts at the same time if you can.  If your baby gets formula feeding, pump around the time your baby gets that feeding.  If you drank alcohol, wait 2 hours before pumping.  If you are having a procedure with anesthesia, talk to your health care provider about when you should pump before and after. How do I prepare to pump? Take steps to relax. This makes it easier to stimulate your let-down reflex, which is what makes breast milk flow. To help:  Smell one of your infant's blankets or an item of clothing.  Look at a picture or video of your infant.  Sit in a quiet, private space.  Massage your breast and nipple.  Place a warm cloth on your breast. The cloth should be a little wet.  Play relaxing music.  Picture your milk flowing. What are some tips? General tips for pumping breast milk  Always wash your hands before pumping.  If you are not getting very much milk or pumping is uncomfortable, make adjustments to your pump or try using different type of  pumps.  Drink enough fluid to keep your urine clear or pale yellow.  Wear clothing that opens in the front or allows easy access to your breasts.  Pump breast milk directly into clean bottles or other storage containers.  Do not use any products that contain nicotine or tobacco, such as cigarettes and e-cigarettes. These can lower your milk supply and harm your infant. If you need help quitting, ask your health care provider. Tips for storing breast milk  Store breast milk in a clean, BPA-free container, such as glass or plastic bottles or milk storage bags.  Store breast milk in 2-4 ounce batches to reduce waste.  Swirl the breast milk in the container to mix any cream that floats to the top. Do not shake it.  Label all stored milk with the date you pumped it.  The amount of time you can keep breast milk depends on where it is stored: ? Room temperature: 6-8 hours, if the milk is clean. It is best if used within 4 hours. ? Cooler with ice packs: 24 hours. ? Refrigerator: 5-8 days, if the milk is clean. It is best if used within 3 days. ? Freezer: 9-12 months, if the milk is clean and stored away from the freezer door. It is best if used within 6 months.  When using a refrigerator or freezer,  put the milk in the back to keep it as cold as possible.  Thaw frozen milk using warm water. Do not use the microwave. Tips for choosing a breast pump The right pump for you will depend on your comfort and how often you will be away from your baby. When choosing a pump, consider the following:  Manual breast pumps do not need electricity to work. They are usually cheaper than electric pumps, but they can be harder to use. They may be a good choice if you are occasionally away from your baby.  Electric breast pumps are usually more expensive than manual pumps, but they can be easier for some women to use. They can also collect more milk than manual pumps. This makes them a good choice for women  who work in an office or need to be away from their baby for longer periods of time.  The suction cup (flange) should be the right size. If it is the wrong size, it may cause pain and nipple damage.  Before buying a pump, find out whether your insurance covers the cost of a breast pump. Tips for maintaining a breast pump  Check your pump's manual for cleaning tips.  Clean the pump after each use. To do this: ? Wipe down the electrical unit. Use a dry, soft cloth or clean paper towel. Do not put the electrical unit in water or cleaning products. ? Wash the plastic pump parts with soap and warm water or in the dishwasher, if the parts are dishwasher safe. You do not need to clean the tubing unless it comes in contact with breast milk. Let the parts air dry. Avoid drying them with a cloth or towel. ? When the pump parts are clean and dry, put the pump back together. Then store the pump.  If there is water in the tubing when it comes time to pump, attach the tubing to the pump and turn on the pump. Run the pump until the tube is dry.  Avoid touching the inside of pump parts that come in contact with breast milk. Summary  Pumping can help stimulate your milk supply after your baby is born. It can also help maintain your milk supply when you are away from your baby.  When you are away from your infant for several hours, pump for about 15 minutes every 2-3 hours. Pump both breasts at the same time, if you can.  Your health care provider or lactation consultant can help you decide which breast pump is right for you. The right pump for you depends on your comfort, work schedule, and how often you may be away from your baby. This information is not intended to replace advice given to you by your health care provider. Make sure you discuss any questions you have with your health care provider. Document Released: 06/23/2009 Document Revised: 09/22/2017 Document Reviewed: 02/08/2016 Elsevier Interactive  Patient Education  2019 [employer] Cesarean Delivery, Care After This sheet gives you information about how to care for yourself after your procedure. Your health care provider may also give you more specific instructions. If you have problems or questions, contact your health care provider.   Breastfeeding Tips for a Good Latch Latching is how your baby's mouth attaches to your nipple to breastfeed. It is an important part of breastfeeding. Your baby may have trouble latching for a number of reasons. A poor latch may cause you to have cracked or sore nipples or other problems. Follow these instructions  at home: How to position your baby  Find a comfortable place to sit or lie down. Your neck and back should be well supported.  If you are seated, place a pillow or rolled-up blanket under your baby. This will bring him or her to the level of your breast.  Make sure that your baby's belly (abdomen) is facing your belly.  Try different positions to find one that works best for you and your baby. How to help your baby latch   To start, gently rub your breast. Move your fingertips in a circle as you massage from your chest wall toward your nipple. This helps milk flow. Keep doing this during feeding if needed.  Position your breast. Hold your breast with four fingers underneath and your thumb above your nipple. Keep your fingers away from your nipple and your baby's mouth. Follow these steps to help your baby latch: 1. Rub your baby's lips gently with your finger or nipple. 2. When your baby's mouth is open wide enough, quickly bring your baby to your breast and place your whole nipple into your baby's mouth. Place as much of the colored area around your nipple (areola)as possible into your baby's mouth. 3. Your baby's tongue should be between his or her lower gum and your breast. 4. You should be able to see more areola above your baby's upper lip than below the lower lip. 5. When your  baby starts sucking, you will feel a gentle pull on your nipple. You should not feel any pain. Be patient. It is common for a baby to suck for about 2-3 minutes to start the flow of breast milk. 6. Make sure that your baby's mouth is in the right position around your nipple. Your baby's lips should make a seal on your breast and be turned outward.  General instructions  Look for these signs that your baby has latched on to your nipple: ? The baby is quietly tugging or sucking without causing you pain. ? You hear the baby swallow after every 3 or 4 sucks. ? You see movement above and in front of the baby's ears while he or she is sucking.  Be aware of these signs that your baby has not latched on to your nipple: ? The baby makes sucking sounds or smacking sounds while feeding. ? You have nipple pain.  If your baby is not latched well, put your little finger between your baby's gums and your nipple. This will break the seal. Then try to help your baby latch again.  If you keep having problems, get help from a breastfeeding specialist (Science writer). Contact a doctor if:  You have cracking or soreness in your nipples that lasts longer than 1 week.  You have nipple pain.  Your breasts are filled with too much milk (engorgement), and this does not improve after 48-72 hours.  You have a plugged milk duct and a fever.  You follow the tips for a good latch but you keep having problems or concerns.  You have a pus-like fluid coming from your breast.  Your baby is not gaining weight.  Your baby loses weight. Summary  Latching is how your baby's mouth attaches to your nipple to breastfeed.  Try different positions for breastfeeding to find one that works best for you and your baby.  A poor latch may cause you to have cracked or sore nipples or other problems. This information is not intended to replace advice given to you by your  health care provider. Make sure you discuss any  questions you have with your health care provider. Document Released: 08/10/2016 Document Revised: 08/10/2016 Document Reviewed: 08/10/2016 Elsevier Interactive Patient Education  2019 [employer]. What can I expect after the procedure? After the procedure, it is common to have:  A small amount of blood or clear fluid coming from the incision.  Some redness, swelling, and pain in your incision area.  Some abdominal pain and soreness.  Vaginal bleeding (lochia). Even though you did not have a vaginal delivery, you will still have vaginal bleeding and discharge.  Pelvic cramps.  Fatigue. You may have pain, swelling, and discomfort in the tissue between your vagina and your anus (perineum) if:  Your C-section was unplanned, and you were allowed to labor and push.  An incision was made in the area (episiotomy) or the tissue tore during attempted vaginal delivery. Follow these instructions at home: Incision care   Follow instructions from your health care provider about how to take care of your incision. Make sure you: ? Wash your hands with soap and water before you change your bandage (dressing). If soap and water are not available, use hand sanitizer. ? If you have a dressing, change it or remove it as told by your health care provider. ? Leave stitches (sutures), skin staples, skin glue, or adhesive strips in place. These skin closures may need to stay in place for 2 weeks or longer. If adhesive strip edges start to loosen and curl up, you may trim the loose edges. Do not remove adhesive strips completely unless your health care provider tells you to do that.  Check your incision area every day for signs of infection. Check for: ? More redness, swelling, or pain. ? More fluid or blood. ? Warmth. ? Pus or a bad smell.  Do not take baths, swim, or use a hot tub until your health care provider says it's okay. Ask your health care provider if you can take showers.  When you cough  or sneeze, hug a pillow. This helps with pain and decreases the chance of your incision opening up (dehiscing). Do this until your incision heals. Medicines  Take over-the-counter and prescription medicines only as told by your health care provider.  If you were prescribed an antibiotic medicine, take it as told by your health care provider. Do not stop taking the antibiotic even if you start to feel better.  Do not drive or use heavy machinery while taking prescription pain medicine. Lifestyle  Do not drink alcohol. This is especially important if you are breastfeeding or taking pain medicine.  Do not use any products that contain nicotine or tobacco, such as cigarettes, e-cigarettes, and chewing tobacco. If you need help quitting, ask your health care provider. Eating and drinking  Drink at least 8 eight-ounce glasses of water every day unless told not to by your health care provider. If you breastfeed, you may need to drink even more water.  Eat high-fiber foods every day. These foods may help prevent or relieve constipation. High-fiber foods include: ? Whole grain cereals and breads. ? Brown rice. ? Beans. ? Fresh fruits and vegetables. Activity   If possible, have someone help you care for your baby and help with household activities for at least a few days after you leave the hospital.  Return to your normal activities as told by your health care provider. Ask your health care provider what activities are safe for you.  Rest as much as  possible. Try to rest or take a nap while your baby is sleeping.  Do not lift anything that is heavier than 10 lbs (4.5 kg), or the limit that you were told, until your health care provider says that it is safe.  Talk with your health care provider about when you can engage in sexual activity. This may depend on your: ? Risk of infection. ? How fast you heal. ? Comfort and desire to engage in sexual activity. General instructions  Do not  use tampons or douches until your health care provider approves.  Wear loose, comfortable clothing and a supportive and well-fitting bra.  Keep your perineum clean and dry. Wipe from front to back when you use the toilet.  If you pass a blood clot, save it and call your health care provider to discuss. Do not flush blood clots down the toilet before you get instructions from your health care provider.  Keep all follow-up visits for you and your baby as told by your health care provider. This is important. Contact a health care provider if:  You have: ? A fever. ? Bad-smelling vaginal discharge. ? Pus or a bad smell coming from your incision. ? Difficulty or pain when urinating. ? A sudden increase or decrease in the frequency of your bowel movements. ? More redness, swelling, or pain around your incision. ? More fluid or blood coming from your incision. ? A rash. ? Nausea. ? Little or no interest in activities you used to enjoy. ? Questions about caring for yourself or your baby.  Your incision feels warm to the touch.  Your breasts turn red or become painful or hard.  You feel unusually sad or worried.  You vomit.  You pass a blood clot from your vagina.  You urinate more than usual.  You are dizzy or light-headed. Get help right away if:  You have: ? Pain that does not go away or get better with medicine. ? Chest pain. ? Difficulty breathing. ? Blurred vision or spots in your vision. ? Thoughts about hurting yourself or your baby. ? New pain in your abdomen or in one of your legs. ? A severe headache.  You faint.  You bleed from your vagina so much that you fill more than one sanitary pad in one hour. Bleeding should not be heavier than your heaviest period. Summary  After the procedure, it is common to have pain at your incision site, abdominal cramping, and slight bleeding from your vagina.  Check your incision area every day for signs of infection.  Tell  your health care provider about any unusual symptoms.  Keep all follow-up visits for you and your baby as told by your health care provider. This information is not intended to replace advice given to you by your health care provider. Make sure you discuss any questions you have with your health care provider. Document Released: 09/25/2001 Document Revised: 07/12/2017 Document Reviewed: 07/12/2017 Elsevier Interactive Patient Education  2019 [place] incision clean and dry.  Please follow instructions included in incision hygiene kit.  Call your doctor for incision concerns including redness, swelling, bleeding or drainage, or if begins to come apart.  No strenuous activity or heavy lifting for 6 weeks.  No intercourse, tampons, or douching for 6 weeks.  No tub baths- showers only.  No driving for 2 weeks or while taking pain medications.  Call your doctor for increased pain or vaginal bleeding, temperature above 100.4, depression, or concerns.  Continue prenatal vitamin and iron.  Increase calories and fluids while breastfeeding.

## 2018-02-04 NOTE — Progress Notes (Signed)
Discharge instructions provided.  Pt and sig other verbalize understanding of all instructions and follow-up care.  Incision hygiene kit given.  Pt discharged to home with infant at 1455 on 02/04/18 via wheelchair by volunteer. Reed Breech, RN 02/04/2018

## 2018-02-04 NOTE — Progress Notes (Signed)
Pt. States relief of pain after B.M.

## 2018-02-04 NOTE — Lactation Note (Addendum)
This note was copied from a baby's chart. Lactation Consultation Note  Patient Name: Eileen Parsons Today's Date: 02/04/2018  When went in room earlier, mom voiced that she really did not like pumping, but would be willing to pump to try and supply breast milk for [name].  Mom concerned that she is only getting a couple of drops of colostrum on tip of nipple when tries to pump.  Explained supply and demand and need to stimulate the breast by breast feeding, hand expressing or pumping to bring in mature milk and ensure a plentiful milk supply.  Reviewed normal course of lactation and routine newborn feeding patterns.  Assisted mom with hand expression and pumping at 9:15am since FOB had just given a bottle and [name] was too sleepy to breast feed for now.  Mom was surprised that she could hand express more than she could pump.  Encouraged mom to call if [name] demonstrated any feeding cues and we would try to breast feed rather than give formula.  [name] is presently still getting 22 calorie formula.  Neonatologist, [employer], called to question why she was still on 22 Cal Similac.  Order changed to 20 calorie [employer].   When went in at 11 am, mom reports she was asleep when FOB fed again a few minutes earlier.  Mom wanting to put [name] to the breast since she was still demonstrating sucking on pacifier.  Attempted to put her to the breast, but the more we tried the more frustrated she became.  We put her skin to skin and calmed her down.  We then placed a #20 nipple shield on the breast.  She took a few sucks with nipple shield and then started fussing again.  We put a few drops of formula in tip of nipple shield and she took several more good rhythmic sucks before falling asleep.  Encouraged mom to keep putting her to the breast in place of giving a pacifier.  [facility]ymphony DEBP rented through [facility] for a week in hopes that [name] will begin breast feeding and/or she can get DEBP through  [facility].  Information faxed to [facility] for mom to get DEBP.  Community lactation resources reviewed and encouraged to call with questions, concerns or if wanted to call for out patient lactation consult to get her back to the breast if needed.  Maternal Data    Feeding    LATCH Score                   Interventions    Lactation Tools Discussed/Used     Consult Status      Louis Meckel 02/04/2018, 8:37 PM

## 2018-02-05 ENCOUNTER — Encounter: Payer: BLUE CROSS/BLUE SHIELD | Admitting: Obstetrics and Gynecology

## 2018-02-06 ENCOUNTER — Ambulatory Visit (INDEPENDENT_AMBULATORY_CARE_PROVIDER_SITE_OTHER): Payer: BLUE CROSS/BLUE SHIELD | Admitting: Obstetrics and Gynecology

## 2018-02-06 ENCOUNTER — Encounter: Payer: Self-pay | Admitting: Obstetrics and Gynecology

## 2018-02-06 VITALS — BP 112/80 | Wt 276.0 lb

## 2018-02-06 DIAGNOSIS — Z4889 Encounter for other specified surgical aftercare: Secondary | ICD-10-CM

## 2018-02-06 NOTE — Progress Notes (Signed)
      Postoperative Follow-up Patient presents post op from 1LTCS 1weeks ago for fetal arrythmia.  Subjective: Patient reports some improvement in her preop symptoms. Eating a regular diet without difficulty. Pain is controlled with current analgesics. Medications being used: prescription NSAID's including ibuprofen and narcotic analgesics including percocet.  Activity: normal activities of daily living.  Objective: Blood pressure 112/80, weight 276 lb (125.2 kg), currently breastfeeding.  General: NAD Pulmonary: no increased work of breathing Abdomen: soft, non-tender, non-distended, incision D/C/I, ON-Q catheters removed intact Extremities: no edema Neurologic: normal gait    Admission on 02/01/2018, Discharged on 02/04/2018  Component Date Value Ref Range Status  . WBC 02/01/2018 11.9* 4.0 - 10.5 K/uL Final  . RBC 02/01/2018 4.52  3.87 - 5.11 MIL/uL Final  . Hemoglobin 02/01/2018 12.5  12.0 - 15.0 g/dL Final  . HCT 50/56/9794 38.9  36.0 - 46.0 % Final  . MCV 02/01/2018 86.1  80.0 - 100.0 fL Final  . MCH 02/01/2018 27.7  26.0 - 34.0 pg Final  . MCHC 02/01/2018 32.1  30.0 - 36.0 g/dL Final  . RDW 80/16/5537 13.3  11.5 - 15.5 % Final  . Platelets 02/01/2018 396  150 - 400 K/uL Final  . nRBC 02/01/2018 0.0  0.0 - 0.2 % Final   Performed at [facility], [address] [place], [place] [address]  . ABO/RH(D) 02/01/2018 O POS   Final  . Antibody Screen 02/01/2018 NEG   Final  . Sample Expiration 02/01/2018    Final                   Value:02/04/2018 Performed at [facility], [address], [place], [place] [address]   . WBC 02/02/2018 13.0* 4.0 - 10.5 K/uL Final  . RBC 02/02/2018 4.08  3.87 - 5.11 MIL/uL Final  . Hemoglobin 02/02/2018 11.5* 12.0 - 15.0 g/dL Final  . HCT 44/92/0100 35.4* 36.0 - 46.0 % Final  . MCV 02/02/2018 86.8  80.0 - 100.0 fL Final  . MCH 02/02/2018 28.2  26.0 - 34.0 pg Final  . MCHC 02/02/2018 32.5  30.0 - 36.0 g/dL Final  . RDW  71/21/9758 13.3  11.5 - 15.5 % Final  . Platelets 02/02/2018 308  150 - 400 K/uL Final  . nRBC 02/02/2018 0.0  0.0 - 0.2 % Final   Performed at [facility], [address] [place], [place] [address]    Assessment: 28 y.o. s/p 1LTCS for fetal arrythmia stable  Plan: Patient has done well after surgery with no apparent complications.  I have discussed the post-operative course to date, and the expected progress moving forward.  The patient understands what complications to be concerned about.  I will see the patient in routine follow up, or sooner if needed.    Activity plan: No heavy lifting.   Vena Austria, MD, [facility], [facility] 02/06/2018, 9:41 AM

## 2018-02-08 ENCOUNTER — Encounter: Payer: BLUE CROSS/BLUE SHIELD | Admitting: Obstetrics and Gynecology

## 2018-02-09 LAB — SURGICAL PATHOLOGY

## 2018-02-15 ENCOUNTER — Telehealth: Payer: Self-pay

## 2018-02-15 NOTE — Telephone Encounter (Signed)
FMLA/DISABILITY form for [employer] filled out, signature obtained and given to [place] for processing.

## 2018-03-13 ENCOUNTER — Ambulatory Visit (INDEPENDENT_AMBULATORY_CARE_PROVIDER_SITE_OTHER): Payer: BLUE CROSS/BLUE SHIELD | Admitting: Obstetrics and Gynecology

## 2018-03-13 ENCOUNTER — Encounter: Payer: Self-pay | Admitting: Obstetrics and Gynecology

## 2018-03-13 VITALS — BP 110/70 | HR 69 | Ht 67.0 in | Wt 266.0 lb

## 2018-03-13 DIAGNOSIS — Z4889 Encounter for other specified surgical aftercare: Secondary | ICD-10-CM

## 2018-03-13 DIAGNOSIS — Z3009 Encounter for other general counseling and advice on contraception: Secondary | ICD-10-CM

## 2018-03-13 MED ORDER — DROSPIRENONE 4 MG PO TABS: 1.0000 | ORAL_TABLET | Freq: Every day | ORAL | 11 refills | Status: DC

## 2018-03-13 NOTE — Progress Notes (Signed)
Delayed Chart Closure   Postpartum Visit  Chief Complaint:  Chief Complaint  Patient presents with  . Postpartum Care    6wpp    History of Present Illness: Patient is a 28 y.o. [id number] presents for postpartum visit.  Lochia: [place] Post partum depression/anxiety noted:  no Any problems since the delivery:  no  Newborn Details:  SINGLETON :  Infant Status: Infant doing well at home with mother.   Review of Systems: ROS   Past Medical History:  Past Medical History:  Diagnosis Date  . History of shoulder dystocia in prior pregnancy    with first pregnancy  . Medical history non-contributory   . Obesity    BMI=40.25 kb/m2    Past Surgical History:  Past Surgical History:  Procedure Laterality Date  . CESAREAN SECTION N/A 02/01/2018   Procedure: CESAREAN SECTION;  Surgeon: Natale Milch, MD;  Location: [facility] ORS;  Service: Obstetrics;  Laterality: N/A;  . MOUTH SURGERY      Family History:  Family History  Problem Relation Age of Onset  . Alcohol abuse Mother   . Diabetes Mellitus II Paternal Aunt   . Diabetes Mellitus II Paternal Uncle   . Lupus Maternal Grandmother   . Kidney failure Paternal Grandfather   . Breast cancer Neg Hx   . Ovarian cancer Neg Hx   . Colon cancer Neg Hx     Social History:  Social History   Socioeconomic History  . Marital status: Single    Spouse name: Rod  . Number of children: 1  . Years of education: Not on file  . Highest education level: Not on file  Occupational History  . Not on file  Tobacco Use  . Smoking status: Never Smoker  . Smokeless tobacco: Never Used  Vaping Use  . Vaping Use: Never used  Substance and Sexual Activity  . Alcohol use: Not Currently    Comment: weekends-stopped alcohol with pregnancy  . Drug use: Never  . Sexual activity: Yes    Birth control/protection: I.U.D.    Comment: planning Paraguard after delivery  Other Topics Concern  . Not on file  Social History Narrative   . Not on file   Social Determinants of Health   Financial Resource Strain: Not on file  Food Insecurity: Not on file  Transportation Needs: Not on file  Physical Activity: Not on file  Stress: Not on file  Social Connections: Not on file  Intimate Partner Violence: Not on file    Allergies:  No Known Allergies  Medications: Prior to Admission medications   Medication Sig Start Date End Date Taking? Authorizing Provider  brompheniramine-pseudoephedrine-DM 30-2-10 MG/5ML syrup Take 5 mLs by mouth 4 (four) times daily as needed. 01/12/20   Evon Slack, PA-C    Physical Exam Vitals:  Vitals:   03/13/18 1054  BP: 110/70  Pulse: 69    OBGyn Exam  Assessment: 28 y.o. [id number] presenting for 6 week postpartum visit  Plan: Problem List Items Addressed This Visit      Other   RESOLVED: Cesarean delivery delivered   RESOLVED: Postpartum care following cesarean delivery    Other Visit Diagnoses    Postoperative visit    -  Primary   Birth control counseling           1) Contraception-  discussed  2) Patient underwent screening for postpartum depression with no concerns noted.  - Follow up for routine annual exam  Adelene Idler MD, [facility]  OB/GYN, [facility] 04/02/2020 9:30 AM

## 2018-03-27 ENCOUNTER — Other Ambulatory Visit: Payer: Self-pay

## 2018-03-27 ENCOUNTER — Encounter: Payer: Self-pay | Admitting: Emergency Medicine

## 2018-03-27 ENCOUNTER — Emergency Department
Admission: EM | Admit: 2018-03-27 | Discharge: 2018-03-27 | Disposition: A | Discharge status: Home / Self Care | Payer: BLUE CROSS/BLUE SHIELD | Attending: Student in an Organized Health Care Education/Training Program | Admitting: Student in an Organized Health Care Education/Training Program

## 2018-03-27 DIAGNOSIS — J029 Acute pharyngitis, unspecified: Secondary | ICD-10-CM | POA: Diagnosis not present

## 2018-03-27 DIAGNOSIS — Z79899 Other long term (current) drug therapy: Secondary | ICD-10-CM | POA: Insufficient documentation

## 2018-03-27 MED ORDER — PSEUDOEPH-BROMPHEN-DM 30-2-10 MG/5ML PO SYRP: 10.0000 mL | ORAL_SOLUTION | Freq: Four times a day (QID) | ORAL | 0 refills | Status: DC | PRN

## 2018-03-27 NOTE — ED Triage Notes (Signed)
Says chills, lost voice, has not taken temp.  Body aches and sore thorat since saturday

## 2018-03-27 NOTE — Discharge Instructions (Signed)
Please follow-up with the primary care provider of your choice for symptoms that are not improving over the next few days.  Keep well-hydrated and take Tylenol or ibuprofen for body aches or fever.  Return to the emergency department for symptoms of change or worsen if you are unable to schedule an appointment.

## 2018-03-27 NOTE — ED Provider Notes (Signed)
[facility] Emergency Department Provider Note  ____________________________________________  Time seen: Approximately 3:26 PM  I have reviewed the triage vital signs and the nursing notes.   HISTORY  Chief Complaint Sore Throat; Chills; and Hoarse   HPI Eileen Parsons is a 28 y.o. female who presents to the emergency department for treatment and evaluation of URI symptoms for the past 3 days.  Patient states that multiple family members have had similar illnesses.  She is most concerned because she has a 33-monthold.  She states that she has been coughing up clear to yellow phlegm.  She denies fever.  She complains of body aches and cough as well as sore throat and hoarse voice.  She is not currently breast-feeding.    Past Medical History:  Diagnosis Date  . History of shoulder dystocia in prior pregnancy    with first pregnancy  . Medical history non-contributory   . Obesity    BMI=40.25 kb/m2    Patient Active Problem List   Diagnosis Date Noted  . Cesarean delivery delivered 02/04/2018  . Postpartum care following cesarean delivery 02/04/2018  . Fetal arrhythmia affecting pregnancy, antepartum 02/01/2018  . Supervision of high risk pregnancy, antepartum 07/26/2017  . Obesity in pregnancy 07/26/2017  . History of shoulder dystocia in prior pregnancy, currently pregnant 07/26/2017    Past Surgical History:  Procedure Laterality Date  . CESAREAN SECTION N/A 02/01/2018   Procedure: CESAREAN SECTION;  Surgeon: SHomero Fellers MD;  Location: [facility] ORS;  Service: Obstetrics;  Laterality: N/A;  . MOUTH SURGERY      Prior to Admission medications   Medication Sig Start Date End Date Taking? Authorizing Provider  brompheniramine-pseudoephedrine-DM 30-2-10 MG/5ML syrup Take 10 mLs by mouth 4 (four) times daily as needed. 03/27/18   Georgiann Neider B, FNP  Drospirenone (SLYND) 4 MG TABS Take 1 tablet by mouth daily. 03/13/18   Schuman, CStefanie Libel MD   norethindrone (MICRONOR,CAMILA,ERRIN) 0.35 MG tablet Take 1 tablet (0.35 mg total) by mouth daily. 03/04/18   GDalia Heading CNM    Allergies Patient has no known allergies.  Family History  Problem Relation Age of Onset  . Alcohol abuse Mother   . Diabetes Mellitus II Paternal Aunt   . Diabetes Mellitus II Paternal Uncle   . Lupus Maternal Grandmother   . Kidney failure Paternal Grandfather   . Breast cancer Neg Hx   . Ovarian cancer Neg Hx   . Colon cancer Neg Hx     Social History Social History   Tobacco Use  . Smoking status: Never Smoker  . Smokeless tobacco: Never Used  Substance Use Topics  . Alcohol use: Not Currently    Comment: weekends-stopped alcohol with pregnancy  . Drug use: Never    Review of Systems Constitutional: Negative for fever/chills.  Normal appetite. ENT: Positive sore throat. Cardiovascular: Denies chest pain. Respiratory: Negative shortness of breath.  Positive for cough.  Negative wheezing.  Gastrointestinal: Negative for nausea, no vomiting.  No diarrhea.  Musculoskeletal: Positive for body aches Skin: Negative for rash. Neurological: Negative for headaches ____________________________________________   PHYSICAL EXAM:  VITAL SIGNS: ED Triage Vitals  Enc Vitals Group     BP 03/27/18 1456 139/72     Pulse Rate 03/27/18 1456 82     Resp 03/27/18 1456 18     Temp 03/27/18 1456 98.8 F (37.1 C)     Temp Source 03/27/18 1456 Oral     SpO2 03/27/18 1456 100 %  Weight 03/27/18 1457 270 lb (122.5 kg)     Height 03/27/18 1457 '5\' 7"'  (1.702 m)     Head Circumference --      Peak Flow --      Pain Score 03/27/18 1459 3     Pain Loc --      Pain Edu? --      Excl. in [place]? --     Constitutional: Alert and oriented.  Well appearing and in no acute distress. Eyes: Conjunctivae are normal. Ears: Bilateral TMs are normal Nose: No sinus congestion noted; no rhinnorhea. Mouth/Throat: Mucous membranes are moist.  Oropharynx  erythematous. Tonsils 1+ without exudate. Uvula midline. Neck: No stridor.  Lymphatic: Bilateral anterior cervical lymphadenopathy. Cardiovascular: Normal rate, regular rhythm. Good peripheral circulation. Respiratory: Respirations are even and unlabored.  No retractions.  Breath sounds clear to auscultation throughout. Gastrointestinal: Soft and nontender.  Musculoskeletal: FROM x 4 extremities.  Neurologic:  Normal speech and language. Skin:  Skin is warm, dry and intact. No rash noted. Psychiatric: Mood and affect are normal. Speech and behavior are normal.  ____________________________________________   LABS (all labs ordered are listed, but only abnormal results are displayed)  Labs Reviewed - No data to display ____________________________________________  EKG  Not indicated ____________________________________________  RADIOLOGY  Not indicated ____________________________________________   PROCEDURES  Procedure(s) performed: None  Critical Care performed: No ____________________________________________   INITIAL IMPRESSION / ASSESSMENT AND PLAN / ED COURSE  28 y.o. female who presents to the emergency department for treatment and evaluation of sore throat, cough, body aches without fever.  She will be prescribed Bromfed and encouraged to take Tylenol or ibuprofen for body aches.  She was advised to stay well-hydrated and wash her hands often to help prevent spreading to HER-59-monthold.  She was advised to call the pediatrician if she believes that her infant has influenza. She was advised to return to the ER for herself or the child if unable to schedule an appointment.   Medications - No data to display  ED Discharge Orders         Ordered    brompheniramine-pseudoephedrine-DM 30-2-10 MG/5ML syrup  4 times daily PRN     03/27/18 1541           Pertinent labs & imaging results that were available during my care of the patient were reviewed by me and  considered in my medical decision making (see chart for details).    If controlled substance prescribed during this visit, [place] prescription for Schedule II or III opiod. ____________________________________________   FINAL CLINICAL IMPRESSION(S) / ED DIAGNOSES  Final diagnoses:  Acute pharyngitis, unspecified etiology    Note:  This document was prepared using Dragon voice recognition software and may include unintentional dictation errors.    TVictorino Dike FNP 03/27/18 1550    RMerlyn Lot MD 03/27/18 1Johnnye Lana

## 2018-03-27 NOTE — ED Notes (Signed)
See triage note  States she developed body aches ,sore throat and chills  Denies any fever and is afebrile on arrival

## 2018-06-29 ENCOUNTER — Telehealth: Payer: Self-pay

## 2018-06-29 NOTE — Telephone Encounter (Signed)
Pt calling; never received release papers to return to work p having baby.  Can she have a copy of those to give to her job?  [phone]

## 2018-07-03 ENCOUNTER — Other Ambulatory Visit: Payer: Self-pay | Admitting: Obstetrics and Gynecology

## 2018-07-03 NOTE — Telephone Encounter (Signed)
Patient has returned to work 04/10/18. Patient would like a call when to pick up return to work letter. Please advise

## 2018-07-03 NOTE — Telephone Encounter (Signed)
Can you put a return to work note in her chart and I will have you sign it when you are back in the office on Thursday. Thank you!

## 2018-07-03 NOTE — Telephone Encounter (Signed)
I saw her 03/13/2018. I wrote her a note that says I saw her then and she was okay to go back to work. You can print it. This was 4 months ago. Is there a reason this being asked for now? This doesn't make a lot of sense.

## 2018-09-02 ENCOUNTER — Other Ambulatory Visit: Payer: Self-pay | Admitting: Obstetrics and Gynecology

## 2018-12-09 ENCOUNTER — Other Ambulatory Visit: Payer: Self-pay

## 2018-12-09 ENCOUNTER — Emergency Department
Admission: EM | Admit: 2018-12-09 | Discharge: 2018-12-09 | Disposition: A | Discharge status: Home / Self Care | Payer: Medicaid Other | Attending: Emergency Medicine | Admitting: Emergency Medicine

## 2018-12-09 ENCOUNTER — Encounter: Payer: Self-pay | Admitting: *Deleted

## 2018-12-09 DIAGNOSIS — Z79899 Other long term (current) drug therapy: Secondary | ICD-10-CM | POA: Diagnosis not present

## 2018-12-09 DIAGNOSIS — K0889 Other specified disorders of teeth and supporting structures: Secondary | ICD-10-CM

## 2018-12-09 MED ORDER — AMOXICILLIN 500 MG PO TABS: 500.0000 mg | ORAL_TABLET | Freq: Three times a day (TID) | ORAL | 0 refills | Status: DC

## 2018-12-09 MED ORDER — TRAMADOL HCL 50 MG PO TABS: 50.0000 mg | ORAL_TABLET | Freq: Four times a day (QID) | ORAL | 0 refills | Status: DC | PRN

## 2018-12-09 MED ORDER — AMOXICILLIN 500 MG PO CAPS
500.0000 mg | ORAL_CAPSULE | Freq: Once | ORAL | Status: AC
  Administered 2018-12-09: 500 mg via ORAL
  Filled 2018-12-09: qty 1

## 2018-12-09 MED ORDER — NAPROXEN 500 MG PO TABS
500.0000 mg | ORAL_TABLET | Freq: Once | ORAL | Status: AC
  Administered 2018-12-09: 20:00:00 500 mg via ORAL
  Filled 2018-12-09: qty 1

## 2018-12-09 NOTE — ED Provider Notes (Signed)
[facility] Emergency Department Provider Note ____________________________________________  Time seen: Approximately 7:36 PM  I have reviewed the triage vital signs and the nursing notes.   HISTORY  Chief Complaint Dental Pain   HPI Eileen Parsons is a 28 y.o. female presents to the emergency department for treatment and evaluation of dental pain. No relief with OTC medications. No fever.    Past Medical History:  Diagnosis Date  . History of shoulder dystocia in prior pregnancy    with first pregnancy  . Medical history non-contributory   . Obesity    BMI=40.25 kb/m2    Patient Active Problem List   Diagnosis Date Noted  . Cesarean delivery delivered 02/04/2018  . Postpartum care following cesarean delivery 02/04/2018  . Fetal arrhythmia affecting pregnancy, antepartum 02/01/2018  . Supervision of high risk pregnancy, antepartum 07/26/2017  . Obesity in pregnancy 07/26/2017  . History of shoulder dystocia in prior pregnancy, currently pregnant 07/26/2017    Past Surgical History:  Procedure Laterality Date  . CESAREAN SECTION N/A 02/01/2018   Procedure: CESAREAN SECTION;  Surgeon: Homero Fellers, MD;  Location: [facility] ORS;  Service: Obstetrics;  Laterality: N/A;  . MOUTH SURGERY      Prior to Admission medications   Medication Sig Start Date End Date Taking? Authorizing Provider  amoxicillin (AMOXIL) 500 MG tablet Take 1 tablet (500 mg total) by mouth 3 (three) times daily. 12/09/18   Yareth Kearse, Johnette Abraham B, FNP  brompheniramine-pseudoephedrine-DM 30-2-10 MG/5ML syrup Take 10 mLs by mouth 4 (four) times daily as needed. 03/27/18   Jayleene Glaeser B, FNP  Drospirenone (SLYND) 4 MG TABS Take 1 tablet by mouth daily. 03/13/18   Schuman, Stefanie Libel, MD  norethindrone (MICRONOR,CAMILA,ERRIN) 0.35 MG tablet Take 1 tablet (0.35 mg total) by mouth daily. 03/04/18   Dalia Heading, CNM  traMADol (ULTRAM) 50 MG tablet Take 1 tablet (50 mg total) by mouth  every 6 (six) hours as needed. 12/09/18   Victorino Dike, FNP    Allergies Patient has no known allergies.  Family History  Problem Relation Age of Onset  . Alcohol abuse Mother   . Diabetes Mellitus II Paternal Aunt   . Diabetes Mellitus II Paternal Uncle   . Lupus Maternal Grandmother   . Kidney failure Paternal Grandfather   . Breast cancer Neg Hx   . Ovarian cancer Neg Hx   . Colon cancer Neg Hx     Social History Social History   Tobacco Use  . Smoking status: Never Smoker  . Smokeless tobacco: Never Used  Substance Use Topics  . Alcohol use: Yes    Comment: weekends-stopped alcohol with pregnancy  . Drug use: Never    Review of Systems Constitutional: Negative for fever or recent illness. ENT: Positive for dental pain. Musculoskeletal: Negative for trismus of the jaw.  Skin: Negative for wound or lesion. ____________________________________________   PHYSICAL EXAM:  VITAL SIGNS: ED Triage Vitals  Enc Vitals Group     BP 12/09/18 1810 126/69     Pulse Rate 12/09/18 1806 94     Resp 12/09/18 1806 20     Temp 12/09/18 1806 99.7 F (37.6 C)     Temp Source 12/09/18 1806 Oral     SpO2 12/09/18 1806 100 %     Weight 12/09/18 1808 275 lb (124.7 kg)     Height 12/09/18 1808 5\' 7"  (1.702 m)     Head Circumference --      Peak Flow --  Pain Score 12/09/18 1808 10     Pain Loc --      Pain Edu? --      Excl. in [place]? --     Constitutional: Alert and oriented. Well appearing and in no acute distress. Eyes: Conjunctiva are clear without discharge or drainage. Mouth/Throat: Airway is patent Periodontal Exam    Hematological/Lymphatic/Immunilogical: No palpable adenopathy. Respiratory: Respirations even and unlabored. Musculoskeletal: Full ROM of the jaw. Neurologic: Awake, alert, oriented.  Skin:  No gingival edema or associated facial swelling. Psychiatric: Affect and behavior intact.  ____________________________________________   LABS (all  labs ordered are listed, but only abnormal results are displayed)  Labs Reviewed - No data to display ____________________________________________   RADIOLOGY  Not indicated. ____________________________________________   PROCEDURES  Procedure(s) performed:   Procedures  Critical Care performed: No ____________________________________________   INITIAL IMPRESSION / ASSESSMENT AND PLAN / ED COURSE  Eileen Parsons is a 28 y.o. female presents to the ER for treatment of dental pain. She will be treated with amoxicillin and naprosyn and advised to see her dentist as soon as possible.  Pertinent labs & imaging results that were available during my care of the patient were reviewed by me and considered in my medical decision making (see chart for details).  ____________________________________________   FINAL CLINICAL IMPRESSION(S) / ED DIAGNOSES  Final diagnoses:  Pain, dental    New Prescriptions   AMOXICILLIN (AMOXIL) 500 MG TABLET    Take 1 tablet (500 mg total) by mouth 3 (three) times daily.   TRAMADOL (ULTRAM) 50 MG TABLET    Take 1 tablet (50 mg total) by mouth every 6 (six) hours as needed.    If controlled substance prescribed during this visit, 12 month [place] the [place] prior to issuing an initial prescription for Schedule II or III opiod.  Note:  This document was prepared using Dragon voice recognition software and may include unintentional dictation errors.   Chinita Pester, FNP 12/09/18 1945    Shaune Pollack, MD 12/10/18 [phone]

## 2018-12-09 NOTE — ED Triage Notes (Signed)
Pt has right upper toothache and h/a.  sxsince last night  Pt taking otc meds without relief. Pt alert

## 2018-12-09 NOTE — Discharge Instructions (Signed)
Please call and schedule a dental appointment as soon as possible. You will need to be seen within the next 14 days. Return to the emergency department for symptoms that change or worsen if you're unable to schedule an appointment. ° °

## 2019-01-17 ENCOUNTER — Other Ambulatory Visit (HOSPITAL_COMMUNITY)
Admission: RE | Admit: 2019-01-17 | Discharge: 2019-01-17 | Disposition: A | Discharge status: Home / Self Care | Payer: Medicaid Other | Source: Ambulatory Visit | Attending: Advanced Practice Midwife | Admitting: Advanced Practice Midwife

## 2019-01-17 ENCOUNTER — Other Ambulatory Visit: Payer: Self-pay

## 2019-01-17 ENCOUNTER — Ambulatory Visit (INDEPENDENT_AMBULATORY_CARE_PROVIDER_SITE_OTHER): Payer: Medicaid Other | Admitting: Advanced Practice Midwife

## 2019-01-17 ENCOUNTER — Encounter: Payer: Self-pay | Admitting: Advanced Practice Midwife

## 2019-01-17 VITALS — BP 132/67 | Wt 275.0 lb

## 2019-01-17 DIAGNOSIS — Z124 Encounter for screening for malignant neoplasm of cervix: Secondary | ICD-10-CM | POA: Insufficient documentation

## 2019-01-17 DIAGNOSIS — O99211 Obesity complicating pregnancy, first trimester: Secondary | ICD-10-CM

## 2019-01-17 DIAGNOSIS — Z113 Encounter for screening for infections with a predominantly sexual mode of transmission: Secondary | ICD-10-CM | POA: Diagnosis present

## 2019-01-17 DIAGNOSIS — O0990 Supervision of high risk pregnancy, unspecified, unspecified trimester: Secondary | ICD-10-CM | POA: Diagnosis not present

## 2019-01-17 DIAGNOSIS — N912 Amenorrhea, unspecified: Secondary | ICD-10-CM

## 2019-01-17 DIAGNOSIS — Z3201 Encounter for pregnancy test, result positive: Secondary | ICD-10-CM

## 2019-01-17 DIAGNOSIS — Z6841 Body Mass Index (BMI) 40.0 and over, adult: Secondary | ICD-10-CM

## 2019-01-17 DIAGNOSIS — O219 Vomiting of pregnancy, unspecified: Secondary | ICD-10-CM

## 2019-01-17 DIAGNOSIS — O0991 Supervision of high risk pregnancy, unspecified, first trimester: Secondary | ICD-10-CM

## 2019-01-17 DIAGNOSIS — O99210 Obesity complicating pregnancy, unspecified trimester: Secondary | ICD-10-CM

## 2019-01-17 LAB — OB RESULTS CONSOLE GC/CHLAMYDIA: Gonorrhea: NEGATIVE

## 2019-01-17 LAB — POCT URINE PREGNANCY: Preg Test, Ur: POSITIVE — AB

## 2019-01-17 MED ORDER — DOXYLAMINE-PYRIDOXINE 10-10 MG PO TBEC: 2.0000 | DELAYED_RELEASE_TABLET | Freq: Every day | ORAL | 4 refills | Status: DC

## 2019-01-17 NOTE — Progress Notes (Signed)
NOB  N&V sm amount of cramping

## 2019-01-17 NOTE — Progress Notes (Signed)
New Obstetric Patient H&P    Chief Complaint: "Desires prenatal care"   History of Present Illness: Patient is a 28 y.o. [id number] Not Hispanic or Latino female, presents with amenorrhea and positive home pregnancy test. Patient's last menstrual period was 10/25/2018 (exact date). and based on her  LMP, her EDD is Estimated Date of Delivery: 08/01/19 and her EGA is [redacted]w[redacted]d. Cycles are 6-7. days, regular, and occur approximately every : 28 days. Patient conceived due to missed birth control pill. Her last pap smear was 2 years ago at [facility] and was no abnormalities.    She had a urine pregnancy test which was positive 4 week(s)  ago. Her last menstrual period was normal and lasted for  6 or 7 day(s). Since her LMP she claims she has experienced fatigue, nausea, vomiting. She denies vaginal bleeding. Her past medical history is noncontributory. Her prior pregnancies are notable for G1 2014 SVD 8#11oz female with shoulder dx, G2 SAB 2016, G3 SAB 2019, G4 2020 6#15oz female c/s with non-reassuring heart rate.  Since her LMP, she admits to the use of tobacco products  no She claims she has gained  no pounds since the start of her pregnancy.  There are cats in the home in the home  no  She admits close contact with children on a regular basis  yes  She has had chicken pox in the past yes She has had Tuberculosis exposures, symptoms, or previously tested positive for TB   no Current or past history of domestic violence. no  Genetic Screening/Teratology Counseling: (Includes patient, baby's father, or anyone in either family with:)   26. Patient's age >/= 27 at [facility]  no 2. Thalassemia ([place], [place], [place], or Asian background): MCV<80  no 3. Neural tube defect (meningomyelocele, spina bifida, anencephaly)  no 4. Congenital heart defect  no  5. Down syndrome  no 6. Tay-Sachs (Jewish, [place])  no 7. Canavan's Disease  no 8. Sickle cell disease or trait (African)  no  9. Hemophilia or  other blood disorders  no  10. Muscular dystrophy  no  11. Cystic fibrosis  no  12. Huntington's Chorea  no  13. Mental retardation/autism  no 14. Other inherited genetic or chromosomal disorder  no 15. Maternal metabolic disorder (DM, PKU, etc)  no 16. Patient or FOB with a child with a birth defect not listed above no  16a. Patient or FOB with a birth defect themselves no 17. Recurrent pregnancy loss, or stillbirth  no  18. Any medications since LMP other than prenatal vitamins (include vitamins, supplements, OTC meds, drugs, alcohol)  no 19. Any other genetic/environmental exposure to discuss  no  Infection History:   1. Lives with someone with TB or TB exposed  no  2. Patient or partner has history of genital herpes  no 3. Rash or viral illness since LMP  no 4. History of STI (GC, CT, HPV, syphilis, HIV)  no 5. History of recent travel :  no  Other pertinent information:  Patient desire VBAC    Review of Systems:10 point review of systems negative unless otherwise noted in HPI  Past Medical History:  Past Medical History:  Diagnosis Date  . History of shoulder dystocia in prior pregnancy    with first pregnancy  . Medical history non-contributory   . Obesity    BMI=40.25 kb/m2    Past Surgical History:  Past Surgical History:  Procedure Laterality Date  . CESAREAN SECTION N/A 02/01/2018  Procedure: CESAREAN SECTION;  Surgeon: Natale Milch, MD;  Location: [facility] ORS;  Service: Obstetrics;  Laterality: N/A;  . MOUTH SURGERY      Gynecologic History: Patient's last menstrual period was 10/25/2018 (exact date).  Obstetric History: [id number]  Family History:  Family History  Problem Relation Age of Onset  . Alcohol abuse Mother   . Diabetes Mellitus II Paternal Aunt   . Diabetes Mellitus II Paternal Uncle   . Lupus Maternal Grandmother   . Kidney failure Paternal Grandfather   . Breast cancer Neg Hx   . Ovarian cancer Neg Hx   . Colon cancer Neg Hx      Social History:  Social History   Socioeconomic History  . Marital status: Single    Spouse name: Rod  . Number of children: 1  . Years of education: Not on file  . Highest education level: Not on file  Occupational History  . Occupation: Housekeeper  Tobacco Use  . Smoking status: Never Smoker  . Smokeless tobacco: Never Used  Substance and Sexual Activity  . Alcohol use: Yes    Comment: weekends-stopped alcohol with pregnancy  . Drug use: Never  . Sexual activity: Yes    Birth control/protection: None  Other Topics Concern  . Not on file  Social History Narrative  . Not on file   Social Determinants of Health   Financial Resource Strain:   . Difficulty of Paying Living Expenses: Not on file  Food Insecurity:   . Worried About Programme researcher, broadcasting/film/video in the Last Year: Not on file  . Ran Out of Food in the Last Year: Not on file  Transportation Needs:   . Lack of Transportation (Medical): Not on file  . Lack of Transportation (Non-Medical): Not on file  Physical Activity:   . Days of Exercise per Week: Not on file  . Minutes of Exercise per Session: Not on file  Stress:   . Feeling of Stress : Not on file  Social Connections:   . Frequency of Communication with Friends and Family: Not on file  . Frequency of Social Gatherings with Friends and Family: Not on file  . Attends Religious Services: Not on file  . Active Member of Clubs or Organizations: Not on file  . Attends Banker Meetings: Not on file  . Marital Status: Not on file  Intimate Partner Violence:   . Fear of Current or Ex-Partner: Not on file  . Emotionally Abused: Not on file  . Physically Abused: Not on file  . Sexually Abused: Not on file    Allergies:  No Known Allergies  Medications: Prior to Admission medications   Medication Sig Start Date End Date Taking? Authorizing Provider  Doxylamine-Pyridoxine (DICLEGIS) 10-10 MG TBEC Take 2 tablets by mouth at bedtime. If symptoms  persist, add one tablet in the morning and one in the afternoon 01/17/19   Tresea Mall, CNM    Physical Exam Vitals: Blood pressure 132/67, weight 275 lb (124.7 kg), last menstrual period 10/25/2018  General: NAD HEENT: normocephalic, anicteric Thyroid: no enlargement, no palpable nodules Pulmonary: No increased work of breathing, CTAB Cardiovascular: RRR, distal pulses 2+ Abdomen: NABS, soft, non-tender, non-distended.  Umbilicus without lesions.  No hepatomegaly, splenomegaly or masses palpable. No evidence of hernia, FHTs present by hand held doppler Genitourinary:  External: Normal external female genitalia.  Normal urethral meatus, normal  Bartholin's and Skene's glands.    Vagina: Normal vaginal mucosa, no evidence of prolapse.  Cervix: Grossly normal in appearance, no bleeding, no CMT  Uterus:  Non-enlarged, mobile, normal contour.    Adnexa: ovaries non-enlarged, no adnexal masses  Rectal: deferred Extremities: no edema, erythema, or tenderness Neurologic: Grossly intact Psychiatric: mood appropriate, affect full   Assessment: 28 y.o. [id number] at [id number] presenting to initiate prenatal care  Plan: 1) Avoid alcoholic beverages. 2) Patient encouraged not to smoke.  3) Discontinue the use of all non-medicinal drugs and chemicals.  4) Take prenatal vitamins daily.  5) Nutrition, food safety (fish, cheese advisories, and high nitrite foods) and exercise discussed. 6) Hospital and practice style discussed with cross coverage system.  7) Genetic Screening, such as with 1st Trimester Screening, cell free fetal DNA, AFP testing, and Ultrasound, as well as with amniocentesis and CVS as appropriate, is discussed with patient. At the conclusion of today's visit patient requested genetic testing 8) Patient is asked about travel to areas at risk for the Zika virus, and counseled to avoid travel and exposure to mosquitoes or sexual partners who may have themselves been exposed to the  virus. Testing is discussed, and will be ordered as appropriate.  9) Paptima, urine culture today 10) Return in 1 week for dating scan, early 1 hr gtt and other labs including NOB panel, sickle cell screen, MaterniT 21 if dates agree   Tresea MallJane Stephanieann Popescu, CNM [facility] 01/17/2019, 4:00 PM

## 2019-01-17 NOTE — Patient Instructions (Signed)
Exercise During Pregnancy Exercise is an important part of being healthy for people of all ages. Exercise improves the function of your heart and lungs and helps you maintain strength, flexibility, and a healthy body weight. Exercise also boosts energy levels and elevates mood. Most women should exercise regularly during pregnancy. In rare cases, women with certain medical conditions or complications may be asked to limit or avoid exercise during pregnancy. How does this affect me? Along with maintaining general strength and flexibility, exercising during pregnancy can help:  Keep strength in muscles that are used during labor and childbirth.  Decrease low back pain.  Reduce symptoms of depression.  Control weight gain during pregnancy.  Reduce the risk of needing insulin if you develop diabetes during pregnancy.  Decrease the risk of cesarean delivery.  Speed up your recovery after giving birth. How does this affect my baby? Exercise can help you have a healthy pregnancy. Exercise does not cause premature birth. It will not cause your baby to weigh less at birth. What exercises can I do? Many exercises are safe for you to do during pregnancy. Do a variety of exercises that safely increase your heart and breathing rates and help you build and maintain muscle strength. Do exercises exactly as told by your health care provider. You may do these exercises:  Walking or hiking.  Swimming.  Water aerobics.  Riding a stationary bike.  Strength training.  Modified yoga or Pilates. Tell your instructor that you are pregnant. Avoid overstretching, and avoid lying on your back for long periods of time.  Running or jogging. Only choose this type of exercise if you: ? Ran or jogged regularly before your pregnancy. ? Can run or jog and still talk in complete sentences. What exercises should I avoid? Depending on your level of fitness and whether you exercised regularly before your  pregnancy, you may be told to limit high-intensity exercise. You can tell that you are exercising at a high intensity if you are breathing much harder and faster and cannot hold a conversation while exercising. You must avoid:  Contact sports.  Activities that put you at risk for falling on or being hit in the belly, such as downhill skiing, water skiing, surfing, rock climbing, cycling, gymnastics, and horseback riding.  Scuba diving.  Skydiving.  Yoga or Pilates in a room that is heated to high temperatures.  Jogging or running, unless you ran or jogged regularly before your pregnancy. While jogging or running, you should always be able to talk in full sentences. Do not run or jog so fast that you are unable to have a conversation.  Do not exercise at more than 6,000 feet above sea level (high elevation) if you are not used to exercising at high elevation. How do I exercise in a safe way?   Avoid overheating. Do not exercise in very high temperatures.  Wear loose-fitting, breathable clothes.  Avoid dehydration. Drink enough water before, during, and after exercise to keep your urine pale yellow.  Avoid overstretching. Because of hormone changes during pregnancy, it is easy to overstretch muscles, tendons, and ligaments during pregnancy.  Start slowly and ask your health care provider to recommend the types of exercise that are safe for you.  Do not exercise to lose weight. Follow these instructions at home:  Exercise on most days or all days of the week. Try to exercise for 30 minutes a day, 5 days a week, unless your health care provider tells you not to.  If   you actively exercised before your pregnancy and you are healthy, your health care provider may tell you to continue to do moderate to high-intensity exercise.  If you are just starting to exercise or did not exercise much before your pregnancy, your health care provider may tell you to do low to moderate-intensity  exercise. Questions to ask your health care provider  Is exercise safe for me?  What are signs that I should stop exercising?  Does my health condition mean that I should not exercise during pregnancy?  When should I avoid exercising during pregnancy? Stop exercising and contact a health care provider if: You have any unusual symptoms, such as:  Mild contractions of the uterus or cramps in the abdomen.  Dizziness that does not go away when you rest. Stop exercising and get help right away if: You have any unusual symptoms, such as:  Sudden, severe pain in your low back or your belly.  Mild contractions of the uterus or cramps in the abdomen that do not improve with rest and drinking fluids.  Chest pain.  Bleeding or fluid leaking from your vagina.  Shortness of breath. These symptoms may represent a serious problem that is an emergency. Do not wait to see if the symptoms will go away. Get medical help right away. Call your local emergency services (911 in the [place] Do not drive yourself to the hospital. Summary  Most women should exercise regularly throughout pregnancy. In rare cases, women with certain medical conditions or complications may be asked to limit or avoid exercise during pregnancy.  Do not exercise to lose weight during pregnancy.  Your health care provider will tell you what level of physical activity is right for you.  Stop exercising and contact a health care provider if you have mild contractions of the uterus or cramps in the abdomen. Get help right away if these contractions or cramps do not improve with rest and drinking fluids.  Stop exercising and get help right away if you have sudden, severe pain in your low back or belly, chest pain, shortness of breath, or bleeding or leaking of fluid from your vagina. This information is not intended to replace advice given to you by your health care provider. Make sure you discuss any questions you have with your  health care provider. Document Revised: 04/26/2018 Document Reviewed: 02/07/2018 Elsevier Patient Education  2020 [employer] Eating Plan for Pregnant Women While you are pregnant, your body requires additional nutrition to help support your growing baby. You also have a higher need for some vitamins and minerals, such as folic acid, calcium, iron, and vitamin D. Eating a healthy, well-balanced diet is very important for your health and your baby's health. Your need for extra calories varies for the three 3-month segments of your pregnancy (trimesters). For most women, it is recommended to consume:  150 extra calories a day during the first trimester.  300 extra calories a day during the second trimester.  300 extra calories a day during the third trimester. What are tips for following this plan?   Do not try to lose weight or go on a diet during pregnancy.  Limit your overall intake of foods that have "empty calories." These are foods that have little nutritional value, such as sweets, desserts, candies, and sugar-sweetened beverages.  Eat a variety of foods (especially fruits and vegetables) to get a full range of vitamins and minerals.  Take a prenatal vitamin to help meet your additional vitamin and mineral needs   during pregnancy, specifically for folic acid, iron, calcium, and vitamin D.  Remember to stay active. Ask your health care provider what types of exercise and activities are safe for you.  Practice good food safety and cleanliness. Wash your hands before you eat and after you prepare raw meat. Wash all fruits and vegetables well before peeling or eating. Taking these actions can help to prevent food-borne illnesses that can be very dangerous to your baby, such as listeriosis. Ask your health care provider for more information about listeriosis. What does 150 extra calories look like? Healthy options that provide 150 extra calories each day could be any of the  following:  6-8 oz (170-230 g) of plain low-fat yogurt with  cup of berries.  1 apple with 2 teaspoons (11 g) of peanut butter.  Cut-up vegetables with  cup (60 g) of hummus.  8 oz (230 mL) or 1 cup of low-fat chocolate milk.  1 stick of string cheese with 1 medium orange.  1 peanut butter and jelly sandwich that is made with one slice of whole-wheat bread and 1 tsp (5 g) of peanut butter. For 300 extra calories, you could eat two of those healthy options each day. What is a healthy amount of weight to gain? The right amount of weight gain for you is based on your BMI before you became pregnant. If your BMI:  Was less than 18 (underweight), you should gain 28-40 lb (13-18 kg).  Was 18-24.9 (normal), you should gain 25-35 lb (11-16 kg).  Was 25-29.9 (overweight), you should gain 15-25 lb (7-11 kg).  Was 30 or greater (obese), you should gain 11-20 lb (5-9 kg). What if I am having twins or multiples? Generally, if you are carrying twins or multiples:  You may need to eat 300-600 extra calories a day.  The recommended range for total weight gain is 25-54 lb (11-25 kg), depending on your BMI before pregnancy.  Talk with your health care provider to find out about nutritional needs, weight gain, and exercise that is right for you. What foods can I eat?  Fruits All fruits. Eat a variety of colors and types of fruit. Remember to wash your fruits well before peeling or eating. Vegetables All vegetables. Eat a variety of colors and types of vegetables. Remember to wash your vegetables well before peeling or eating. Grains All grains. Choose whole grains, such as whole-wheat bread, oatmeal, or brown rice. Meats and other protein foods Lean meats, including chicken, turkey, fish, and lean cuts of beef, veal, or pork. If you eat fish or seafood, choose options that are higher in omega-3 fatty acids and lower in mercury, such as salmon, herring, mussels, trout, sardines, pollock,  shrimp, crab, and lobster. Tofu. Tempeh. Beans. Eggs. Peanut butter and other nut butters. Make sure that all meats, poultry, and eggs are cooked to food-safe temperatures or "well-done." Two or more servings of fish are recommended each week in order to get the most benefits from omega-3 fatty acids that are found in seafood. Choose fish that are lower in mercury. You can find more information online:  www.fda.gov Dairy Pasteurized milk and milk alternatives (such as almond milk). Pasteurized yogurt and pasteurized cheese. Cottage cheese. Sour cream. Beverages Water. Juices that contain 100% fruit juice or vegetable juice. Caffeine-free teas and decaffeinated coffee. Drinks that contain caffeine are okay to drink, but it is better to avoid caffeine. Keep your total caffeine intake to less than 200 mg each day (which is 12 oz   or 355 mL of coffee, tea, or soda) or the limit as told by your health care provider. Fats and oils Fats and oils are okay to include in moderation. Sweets and desserts Sweets and desserts are okay to include in moderation. Seasoning and other foods All pasteurized condiments. The items listed above may not be a complete list of foods and beverages you can eat. Contact a dietitian for more information. What foods are not recommended? Fruits Unpasteurized fruit juices. Vegetables Raw (unpasteurized) vegetable juices. Meats and other protein foods Lunch meats, bologna, hot dogs, or other deli meats. (If you must eat those meats, reheat them until they are steaming hot.) Refrigerated pat, meat spreads from a meat counter, smoked seafood that is found in the refrigerated section of a store. Raw or undercooked meats, poultry, and eggs. Raw fish, such as sushi or sashimi. Fish that have high mercury content, such as tilefish, shark, swordfish, and king mackerel. To learn more about mercury in fish, talk with your health care provider or look for online resources, such  as:  www.fda.gov Dairy Raw (unpasteurized) milk and any foods that have raw milk in them. Soft cheeses, such as feta, queso blanco, queso fresco, Brie, Camembert cheeses, blue-veined cheeses, and Panela cheese (unless it is made with pasteurized milk, which must be stated on the label). Beverages Alcohol. Sugar-sweetened beverages, such as sodas, teas, or energy drinks. Seasoning and other foods Homemade fermented foods and drinks, such as pickles, sauerkraut, or kombucha drinks. (Store-bought pasteurized versions of these are okay.) Salads that are made in a store or deli, such as ham salad, chicken salad, egg salad, tuna salad, and seafood salad. The items listed above may not be a complete list of foods and beverages you should avoid. Contact a dietitian for more information. Where to find more information To calculate the number of calories you need based on your height, weight, and activity level, you can use an online calculator such as:  www.choosemyplate.gov/MyPlatePlan To calculate how much weight you should gain during pregnancy, you can use an online pregnancy weight gain calculator such as:  www.choosemyplate.gov/pregnancy-weight-gain-calculator Summary  While you are pregnant, your body requires additional nutrition to help support your growing baby.  Eat a variety of foods, especially fruits and vegetables to get a full range of vitamins and minerals.  Practice good food safety and cleanliness. Wash your hands before you eat and after you prepare raw meat. Wash all fruits and vegetables well before peeling or eating. Taking these actions can help to prevent food-borne illnesses, such as listeriosis, that can be very dangerous to your baby.  Do not eat raw meat or fish. Do not eat fish that have high mercury content, such as tilefish, shark, swordfish, and king mackerel. Do not eat unpasteurized (raw) dairy.  Take a prenatal vitamin to help meet your additional vitamin and  mineral needs during pregnancy, specifically for folic acid, iron, calcium, and vitamin D. This information is not intended to replace advice given to you by your health care provider. Make sure you discuss any questions you have with your health care provider. Document Revised: 05/24/2018 Document Reviewed: 09/30/2016 Elsevier Patient Education  2020 Else[employer] Inc. Prenatal Care Prenatal care is health care during pregnancy. It helps you and your unborn baby (fetus) stay as healthy as possible. Prenatal care may be provided by a midwife, a family practice health care provider, or a childbirth and pregnancy specialist (obstetrician). How does this affect me? During pregnancy, you will be closely monitored   for any new conditions that might develop. To lower your risk of pregnancy complications, you and your health care provider will talk about any underlying conditions you have. How does this affect my baby? Early and consistent prenatal care increases the chance that your baby will be healthy during pregnancy. Prenatal care lowers the risk that your baby will be:  Born early (prematurely).  Smaller than expected at birth (small for gestational age). What can I expect at the first prenatal care visit? Your first prenatal care visit will likely be the longest. You should schedule your first prenatal care visit as soon as you know that you are pregnant. Your first visit is a good time to talk about any questions or concerns you have about pregnancy. At your visit, you and your health care provider will talk about:  Your medical history, including: ? Any past pregnancies. ? Your family's medical history. ? The baby's father's medical history. ? Any long-term (chronic) health conditions you have and how you manage them. ? Any surgeries or procedures you have had. ? Any current over-the-counter or prescription medicines, herbs, or supplements you are taking.  Other factors that could pose a risk  to your baby, including:  Your home setting and your stress levels, including: ? Exposure to abuse or violence. ? Household financial strain. ? Mental health conditions you have.  Your daily health habits, including diet and exercise. Your health care provider will also:  Measure your weight, height, and blood pressure.  Do a physical exam, including a pelvic and breast exam.  Perform blood tests and urine tests to check for: ? Urinary tract infection. ? Sexually transmitted infections (STIs). ? Low iron levels in your blood (anemia). ? Blood type and certain proteins on red blood cells (Rh antibodies). ? Infections and immunity to viruses, such as hepatitis B and rubella. ? HIV (human immunodeficiency virus).  Do an ultrasound to confirm your baby's growth and development and to help predict your estimated due date (EDD). This ultrasound is done with a probe that is inserted into the vagina (transvaginal ultrasound).  Discuss your options for genetic screening.  Give you information about how to keep yourself and your baby healthy, including: ? Nutrition and taking vitamins. ? Physical activity. ? How to manage pregnancy symptoms such as nausea and vomiting (morning sickness). ? Infections and substances that may be harmful to your baby and how to avoid them. ? Food safety. ? Dental care. ? Working. ? Travel. ? Warning signs to watch for and when to call your health care provider. How often will I have prenatal care visits? After your first prenatal care visit, you will have regular visits throughout your pregnancy. The visit schedule is often as follows:  Up to week 28 of pregnancy: once every 4 weeks.  28-36 weeks: once every 2 weeks.  After 36 weeks: every week until delivery. Some women may have visits more or less often depending on any underlying health conditions and the health of the baby. Keep all follow-up and prenatal care visits as told by your health care  provider. This is important. What happens during routine prenatal care visits? Your health care provider will:  Measure your weight and blood pressure.  Check for fetal heart sounds.  Measure the height of your uterus in your abdomen (fundal height). This may be measured starting around week 20 of pregnancy.  Check the position of your baby inside your uterus.  Ask questions about your diet, sleeping patterns, and   whether you can feel the baby move.  Review warning signs to watch for and signs of labor.  Ask about any pregnancy symptoms you are having and how you are dealing with them. Symptoms may include: ? Headaches. ? Nausea and vomiting. ? Vaginal discharge. ? Swelling. ? Fatigue. ? Constipation. ? Any discomfort, including back or pelvic pain. Make a list of questions to ask your health care provider at your routine visits. What tests might I have during prenatal care visits? You may have blood, urine, and imaging tests throughout your pregnancy, such as:  Urine tests to check for glucose, protein, or signs of infection.  Glucose tests to check for a form of diabetes that can develop during pregnancy (gestational diabetes mellitus). This is usually done around week 24 of pregnancy.  An ultrasound to check your baby's growth and development and to check for birth defects. This is usually done around week 20 of pregnancy.  A test to check for group B strep (GBS) infection. This is usually done around week 36 of pregnancy.  Genetic testing. This may include blood or imaging tests, such as an ultrasound. Some genetic tests are done during the first trimester and some are done during the second trimester. What else can I expect during prenatal care visits? Your health care provider may recommend getting certain vaccines during pregnancy. These may include:  A yearly flu shot (annual influenza vaccine). This is especially important if you will be pregnant during flu  season.  Tdap (tetanus, diphtheria, pertussis) vaccine. Getting this vaccine during pregnancy can protect your baby from whooping cough (pertussis) after birth. This vaccine may be recommended between weeks 27 and 36 of pregnancy. Later in your pregnancy, your health care provider may give you information about:  Childbirth and breastfeeding classes.  Choosing a health care provider for your baby.  Umbilical cord banking.  Breastfeeding.  Birth control after your baby is born.  The hospital labor and delivery unit and how to tour it.  Registering at the hospital before you go into labor. Where to find more information  Office on Women's Health: womenshealth.gov  American Pregnancy Association: americanpregnancy.org  March of Dimes: marchofdimes.org Summary  Prenatal care helps you and your baby stay as healthy as possible during pregnancy.  Your first prenatal care visit will most likely be the longest.  You will have visits and tests throughout your pregnancy to monitor your health and your baby's health.  Bring a list of questions to your visits to ask your health care provider.  Make sure to keep all follow-up and prenatal care visits with your health care provider. This information is not intended to replace advice given to you by your health care provider. Make sure you discuss any questions you have with your health care provider. Document Revised: 04/25/2018 Document Reviewed: 01/02/2017 Elsevier Patient Education  2020 Elsevier Inc.     COVID-19 and Your Pregnancy FAQ  How can I prevent infection with COVID-19 during my pregnancy? Social distancing is key. Please limit any interactions in public. Try and work from home if possible. Frequently wash your hands after touching possibly contaminated surfaces. Avoid touching your face.  Minimize trips to the store. Consider online ordering when possible.   Should I wear a mask? YES. It is recommended by the CDC  that all people wear a cloth mask or facial covering in public. You should wear a mask to your visits in the office. This will help reduce transmission as well as your   risk or acquiring COVID-19. New studies are showing that even asymptomatic individuals can spread the virus from talking.   Where can I get a mask? [place] and the city of [place] are partnering to provide masks to community members. You can pick up a mask from several locations. This website also has instructions about how to make a mask by sewing or without sewing by using a t-shirt or bandana.  https://www.[place].gov/i-want-to/learn-about/covid-19-information-and-updates/covid-19-face-mask-project  Studies have shown that if you were a tube or nylon stocking from pantyhose over a cloth mask it makes the cloth mask almost as effective as a N95 mask.  https://www.npr.org/sections/goatsandsoda/2018/05/09/840146830/adding-a-nylon-stocking-layer-could-boost-protection-from-cloth-masks-study-find  What are the symptoms of COVID-19? Fever (greater than 100.4 F), dry cough, shortness of breath.  Am I more at risk for COVID-19 since I am pregnant? There is not currently data showing that pregnant women are more adversely impacted by COVID-19 than the general population. However, we know that pregnant women tend to have worse respiratory complications from similar diseases such as the flu and SARS and for this reason should be considered an at-risk population.  What do I do if I am experiencing the symptoms of COVID-19? Testing is being limited because of test availability. If you are experiencing symptoms you should quarantine yourself, and the members of your family, for at least 2 weeks at home.   Please visit this website for more information: https://www.cdc.gov/coronavirus/2019-ncov/if-you-are-sick/steps-when-sick.html  When should I go to the Emergency Room? Please go to the emergency room if you are experiencing  ANY of these symptoms*:  1.    Difficulty breathing or shortness of breath 2.    Persistent pain or pressure in the chest 3.    Confusion or difficulty being aroused (or awakened) 4.    Bluish lips or face  *This list is not all inclusive. Please consult our office for any other symptoms that are severe or concerning.  What do I do if I am having difficulty breathing? You should go to the Emergency Room for evaluation. At this time they have a tent set up for evaluating patients with COVID-19 symptoms.   How will my prenatal care be different because of the COVID-19 pandemic? It has been recommended to reduce the frequency of face-to-face visits and use resources such as telephone and virtual visits when possible. Using a scale, blood pressure machine and fetal doppler at home can further help reduce face-to-face visits. You will be provided with additional information on this topic.  We ask that you come to your visits alone to minimize potential exposures to  COVID-19.  How can I receive childbirth education? At this time in-person classes have been cancelled. You can register for online childbirth education, breastfeeding, and newborn care classes.  Please visit:  [email or web address] for more information  How will my hospital birth experience be different? The hospital is currently limiting visitors. This means that while you are in labor you can only have one person at the hospital with you. Additional family members will not be allowed to wait in the building or outside your room. Your one support person can be the father of the baby, a relative, a doula, or a friend. Once one support person is designated that person will wear a band. This band cannot be shared with multiple people.  Nitrous Gas is not being offered for pain relief since the tubing and filter for the machine can not be sanitized in a way to guarantee prevention of transmission of COVID-19.  Nasal cannula use of    oxygen for fetal indications has also been discontinued.  Currently a clear plastic sheet is being hung between mom and the delivering provider during pushing and delivery to help prevent transmission of COVID-19.      How long will I stay in the hospital for after giving birth? It is also recommended that discharge home be expedited during the COVID-19 outbreak. This means staying for 1 day after a vaginal delivery and 2 days after a cesarean section. Patients who need to stay longer for medical reasons are allowed to do so, but the goal will be for expedited discharge home.   What if I have COVID-19 and I am in labor? We ask that you wear a mask while on labor and delivery. We will try and accommodate you being placed in a room that is capable of filtering the air. Please call ahead if you are in labor and on your way to the hospital. The phone number for labor and delivery at [facility] is [phone].  If I have COVID-19 when my baby is born how can I prevent my baby from contracting COVID-19? This is an issue that will have to be discussed on a case-by-case basis. Current recommendations suggest providing separate isolation rooms for both the mother and new infant as well as limiting visitors. However, there are practical challenges to this recommendation. The situation will assuredly change and decisions will be influenced by the desires of the mother and availability of space.  Some suggestions are the use of a curtain or physical barrier between mom and infant, hand hygiene, mom wearing a mask, or 6 feet of spacing between a mom and infant.   Can I breastfeed during the COVID-19 pandemic?   Yes, breastfeeding is encouraged.  Can I breastfeed if I have COVID-19? Yes. Covid-19 has not been found in breast milk. This means you cannot give COVID-19 to your child through breast milk. Breast feeding will also help pass antibodies to fight infection to your baby.   What  precautions should I take when breastfeeding if I have COVID-19? If a mother and newborn do room-in and the mother wishes to feed at the breast, she should put on a facemask and practice hand hygiene before each feeding.  What precautions should I take when pumping if I have COVID-19? Prior to expressing breast milk, mothers should practice hand hygiene. After each pumping session, all parts that come into contact with breast milk should be thoroughly washed and the entire pump should be appropriately disinfected per the manufacturer's instructions. This expressed breast milk should be fed to the newborn by a healthy caregiver.  What if I am pregnant and work in healthcare? Based on limited data regarding COVID-19 and pregnancy, ACOG currently does not propose creating additional restrictions on pregnant health care personnel because of COVID-19 alone. Pregnant women do not appear to be at higher risk of severe disease related to COVID-19. Pregnant health care personnel should follow CDC risk assessment and infection control guidelines for health care personnel exposed to patients with suspected or confirmed COVID-19. Adherence to recommended infection prevention and control practices is an important part of protecting all health care personnel in health care settings.    Information on COVID-19 in pregnancy is very limited; however, facilities may want to consider limiting exposure of pregnant health care personnel to patients with confirmed or suspected COVID-19 infection, especially during higher-risk procedures (eg, aerosol-generating procedures), if feasible, based on staffing availability.    

## 2019-01-18 NOTE — L&D Delivery Note (Signed)
Vaginal Delivery Note  Spontaneous delivery of live viable female infant from the ROA position through an intact  perineum. Delivery of anterior left shoulder with gentle downward guidance followed by delivery of the right posterior shoulder with gentle upward guidance. Body followed spontaneously. Infant placed on maternal chest. Nursery present and helped with neonatal resuscitation and evaluation. Cord clamped and cut after one minute. Cord blood collected. Placenta delivered spontaneously and intact with a 3 vessel cord.  1st degree laceration. Uterus firm and below umbilicus at the end of the delivery.  Mom and baby recovering in stable condition. Sponge and needle counts were correct at the end of the delivery.  APGARS: 1 minute:8 5 minutes: 9 Weight: pending  Adelene Idler MD [facility], [facility] 07/30/19 4:27 AM

## 2019-01-19 LAB — URINE CULTURE

## 2019-01-24 LAB — CYTOLOGY - PAP
Chlamydia: NEGATIVE
Comment: NEGATIVE
Comment: NEGATIVE
Comment: NORMAL
Diagnosis: NEGATIVE
Neisseria Gonorrhea: NEGATIVE
Trichomonas: NEGATIVE

## 2019-02-01 ENCOUNTER — Other Ambulatory Visit: Payer: Medicaid Other

## 2019-02-01 ENCOUNTER — Ambulatory Visit (INDEPENDENT_AMBULATORY_CARE_PROVIDER_SITE_OTHER): Payer: Medicaid Other | Admitting: Obstetrics & Gynecology

## 2019-02-01 ENCOUNTER — Ambulatory Visit (INDEPENDENT_AMBULATORY_CARE_PROVIDER_SITE_OTHER): Payer: Medicaid Other

## 2019-02-01 ENCOUNTER — Encounter: Payer: Self-pay | Admitting: Obstetrics & Gynecology

## 2019-02-01 ENCOUNTER — Other Ambulatory Visit: Payer: Self-pay

## 2019-02-01 VITALS — BP 120/80 | Wt 277.0 lb

## 2019-02-01 DIAGNOSIS — Z113 Encounter for screening for infections with a predominantly sexual mode of transmission: Secondary | ICD-10-CM

## 2019-02-01 DIAGNOSIS — O99212 Obesity complicating pregnancy, second trimester: Secondary | ICD-10-CM

## 2019-02-01 DIAGNOSIS — O99210 Obesity complicating pregnancy, unspecified trimester: Secondary | ICD-10-CM

## 2019-02-01 DIAGNOSIS — O0990 Supervision of high risk pregnancy, unspecified, unspecified trimester: Secondary | ICD-10-CM

## 2019-02-01 DIAGNOSIS — Z6841 Body Mass Index (BMI) 40.0 and over, adult: Secondary | ICD-10-CM

## 2019-02-01 DIAGNOSIS — Z3689 Encounter for other specified antenatal screening: Secondary | ICD-10-CM

## 2019-02-01 DIAGNOSIS — Z3687 Encounter for antenatal screening for uncertain dates: Secondary | ICD-10-CM | POA: Diagnosis not present

## 2019-02-01 DIAGNOSIS — O0992 Supervision of high risk pregnancy, unspecified, second trimester: Secondary | ICD-10-CM

## 2019-02-01 DIAGNOSIS — O34219 Maternal care for unspecified type scar from previous cesarean delivery: Secondary | ICD-10-CM

## 2019-02-01 DIAGNOSIS — Z1379 Encounter for other screening for genetic and chromosomal anomalies: Secondary | ICD-10-CM

## 2019-02-01 DIAGNOSIS — Z98891 History of uterine scar from previous surgery: Secondary | ICD-10-CM | POA: Insufficient documentation

## 2019-02-01 DIAGNOSIS — Z3A14 14 weeks gestation of pregnancy: Secondary | ICD-10-CM

## 2019-02-01 LAB — OB RESULTS CONSOLE HIV ANTIBODY (ROUTINE TESTING): HIV: NONREACTIVE

## 2019-02-01 LAB — OB RESULTS CONSOLE VARICELLA ZOSTER ANTIBODY, IGG: Varicella: IMMUNE

## 2019-02-01 NOTE — Progress Notes (Signed)
  Subjective  Fetal Movement? yes Nausea? no Leaking Fluid? no Vaginal Bleeding? no  Objective  BP 120/80   Wt 277 lb (125.6 kg)   LMP 10/25/2018 (Exact Date)   BMI 43.38 kg/m  General: NAD Pumonary: no increased work of breathing Abdomen: gravid, non-tender Extremities: no edema Psychiatric: mood appropriate, affect full  Assessment  29 y.o. [id number] at [redacted]w[redacted]d by  08/01/2019, by Last Menstrual Period presenting for routine prenatal visit  Plan   Problem List Items Addressed This Visit      Other   Supervision of high risk pregnancy, antepartum   Obesity affecting pregnancy, antepartum   History of cesarean delivery    Other Visit Diagnoses    Encounter for genetic screening for Down Syndrome    -  Primary   Relevant Orders   MaterniT21 PLUS Core+SCA   [redacted] weeks gestation of pregnancy       Screening, antenatal, for fetal anatomic survey       Relevant Orders   US OB Comp + 14 Wk      Pregnancy#5 Problems (from 10/25/18 to present)    No problems associated with this episode.     Labs, Glucola, NIPT today  Desires VBAC if possible, couneled as to pros and cons of VBAC as well as CS   Review of ULTRASOUND.    I have personally reviewed images and report of recent ultrasound done at [facility].    Plan of management to be discussed with patient.  Annamarie Major, MD, [facility], [facility] 02/01/2019  11:42 AM

## 2019-02-04 LAB — RPR+RH+ABO+RUB AB+AB SCR+CB...
Antibody Screen: NEGATIVE
HIV Screen 4th Generation wRfx: NONREACTIVE
Hematocrit: 39.2 % (ref 34.0–46.6)
Hemoglobin: 12.7 g/dL (ref 11.1–15.9)
Hepatitis B Surface Ag: NEGATIVE
MCH: 27.3 pg (ref 26.6–33.0)
MCHC: 32.4 g/dL (ref 31.5–35.7)
MCV: 84 fL (ref 79–97)
Platelets: 377 10*3/uL (ref 150–450)
RBC: 4.66 x10E6/uL (ref 3.77–5.28)
RDW: 13.1 % (ref 11.7–15.4)
RPR Ser Ql: NONREACTIVE
Rh Factor: POSITIVE
Rubella Antibodies, IGG: 1.65 index (ref >0.99)
Varicella zoster IgG: 480 index (ref >165)
WBC: 8.4 10*3/uL (ref 3.4–10.8)

## 2019-02-04 LAB — HEMOGLOBINOPATHY EVALUATION
HGB C: 0 %
HGB S: 0 %
HGB VARIANT: 0 %
Hemoglobin A2 Quantitation: 2.5 % (ref 1.8–3.2)
Hemoglobin F Quantitation: 0 % (ref 0.0–2.0)
Hgb A: 97.5 % (ref 96.4–98.8)

## 2019-02-04 LAB — GLUCOSE, 1 HOUR GESTATIONAL: Gestational Diabetes Screen: 99 mg/dL (ref 65–139)

## 2019-02-11 LAB — MATERNIT21 PLUS CORE+SCA

## 2019-02-22 ENCOUNTER — Encounter: Payer: Medicaid Other | Admitting: Obstetrics & Gynecology

## 2019-02-22 IMAGING — US US FETAL BPP W/O NONSTRESS
1 series · 14 of 20 positions shown · non-contrast
Comparison: none

CLINICAL DATA: Current assigned gestational age of 37 weeks 2 days.
Fetal arrhythmia.

EXAM:
BIOPHYSICAL PROFILE

[Series 1: us fetal bpp w/o nonstress · 0.23mm/px · 20 acquisitions, 14 frames shown]
[im 1/20]
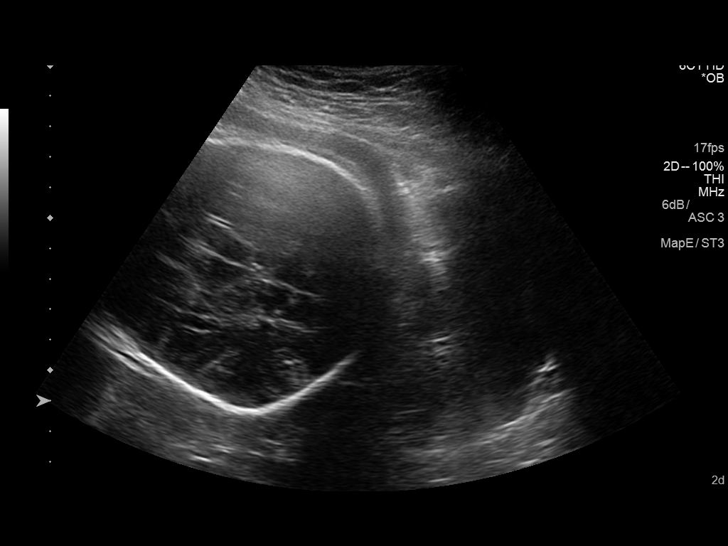
[im 3/20]
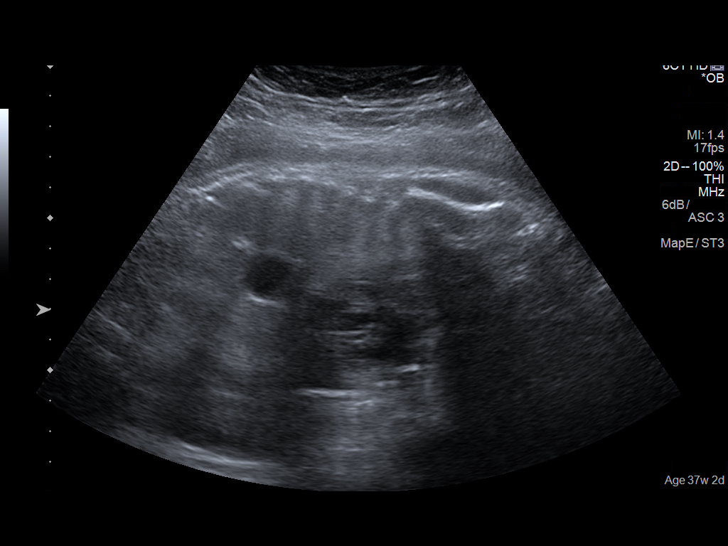
[im 4/20]
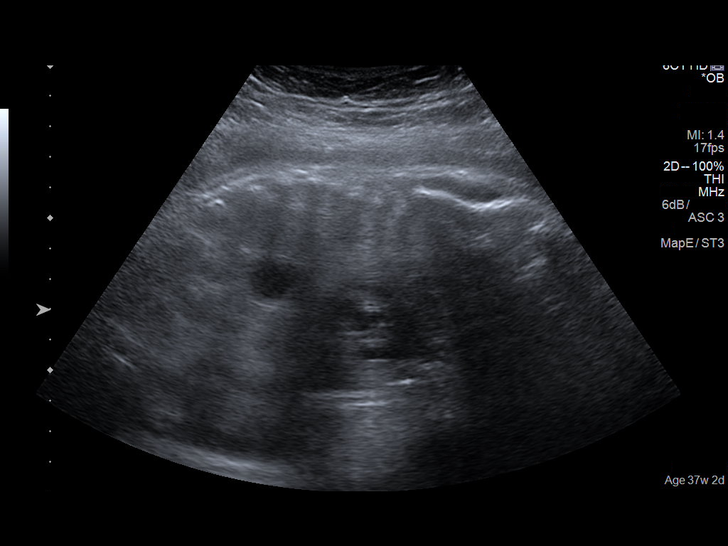
[im 6/20]
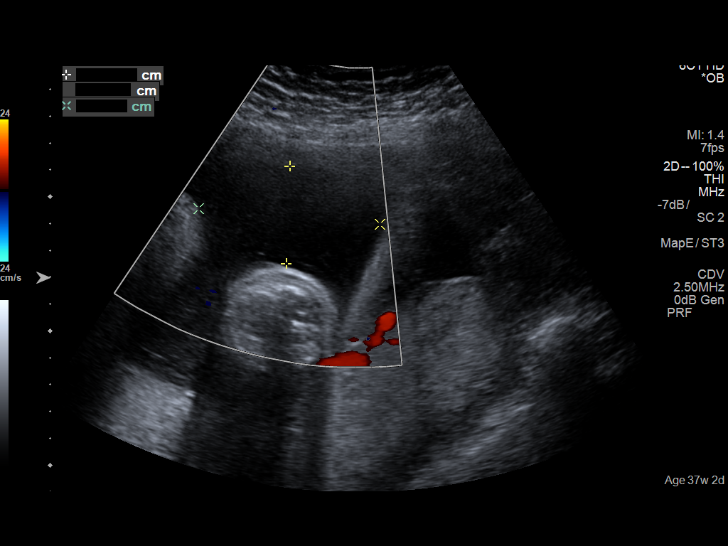
[im 7/20]
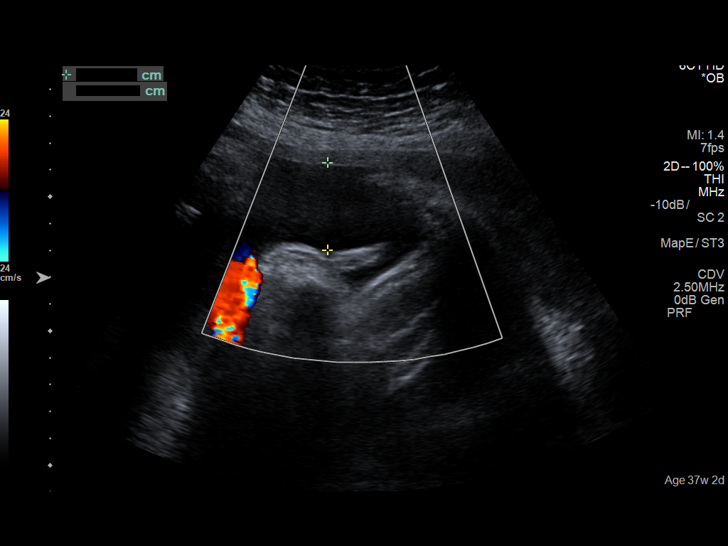
[im 8/20]
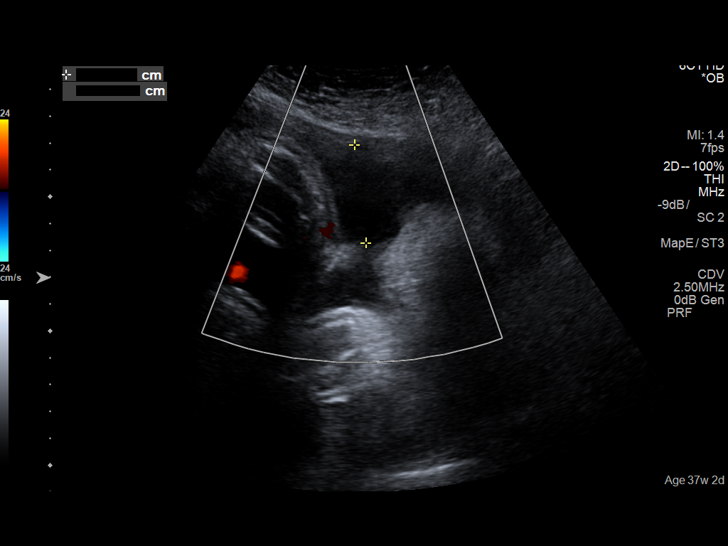
[im 10/20]
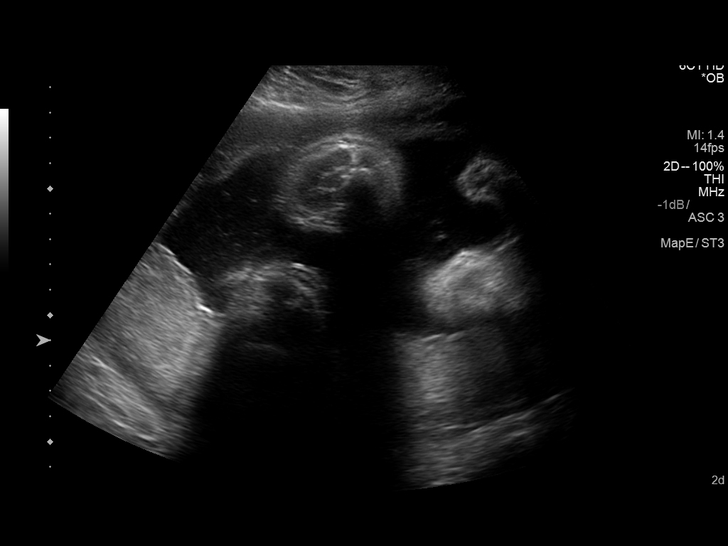
[im 11/20]
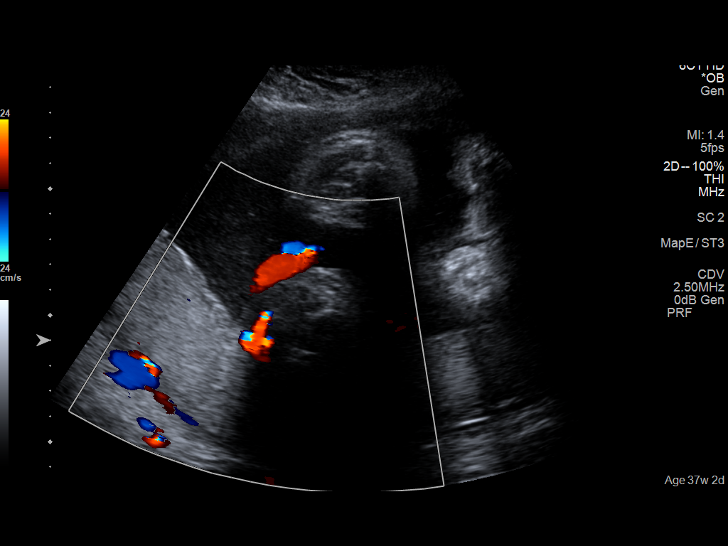
[im 13/20]
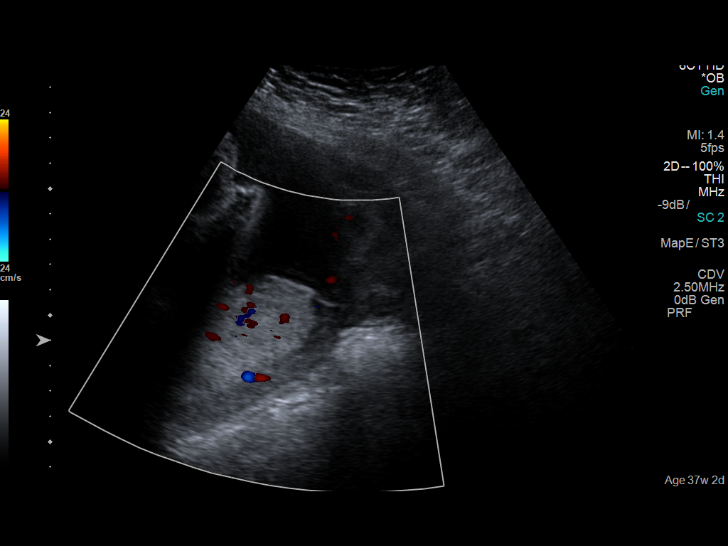
[im 14/20]
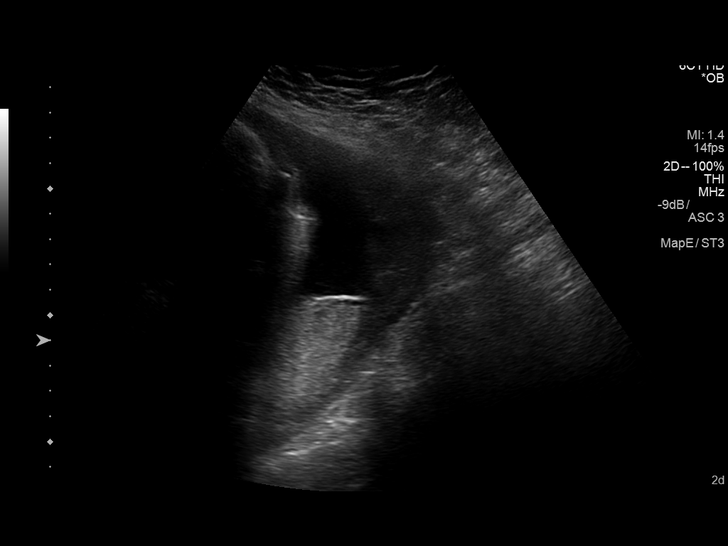
[im 16/20]
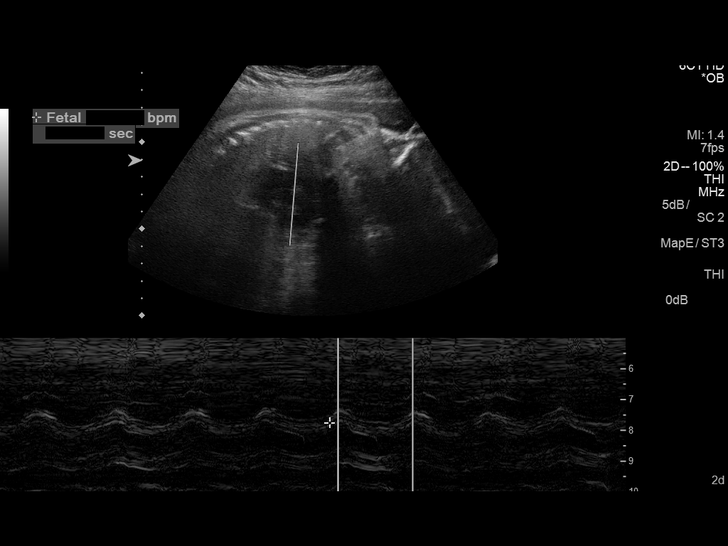
[im 17/20]
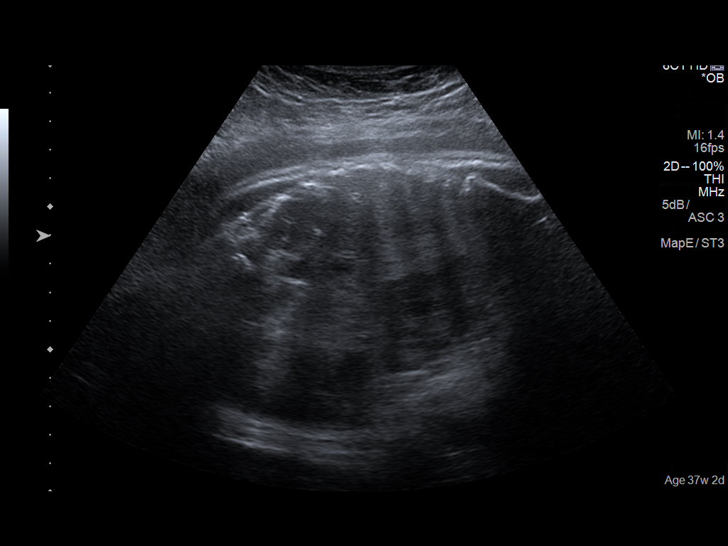
[im 18/20]
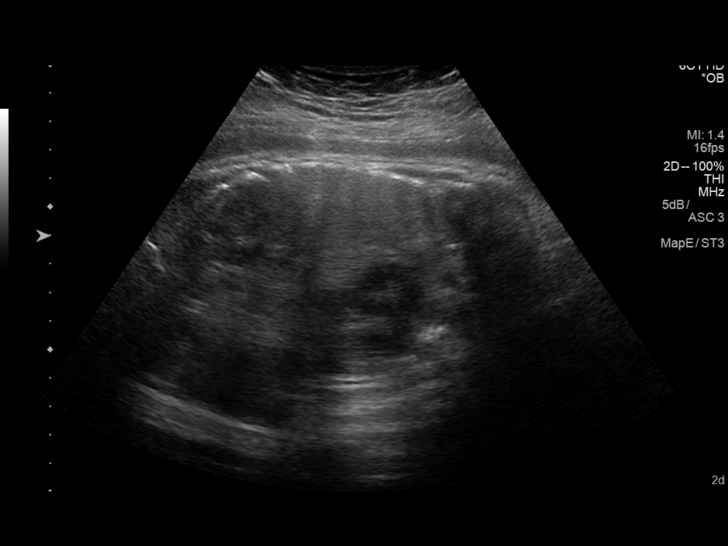
[im 20/20]
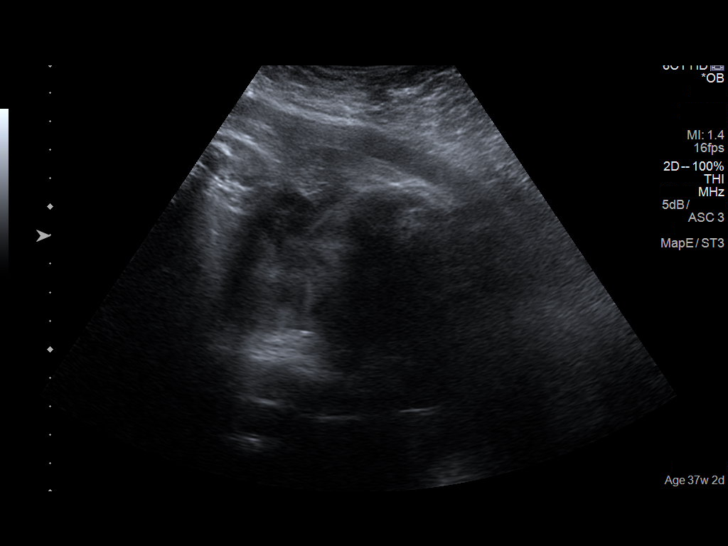

[14 of 20 positions shown; findings below may reference images not displayed]

FINDINGS: Single living intrauterine fetus is seen in cephalic presentation.

Movement: 2 time: 15 minutes

Breathing: 2

Tone: 2

Amniotic Fluid: 2

Total Score: 8
IMPRESSION: Biophysical profile score of [DATE].

## 2019-02-24 IMAGING — US US MFM OB LIMITED
1 series · 10 of 10 positions shown · non-contrast
Comparison: none

PATIENT INFO:

PERFORMED BY:
                   Sonographer
SERVICE(S) PROVIDED:
  US MFM OB LIMITED                                    76815.01
 ----------------------------------------------------------------------
INDICATIONS:
  37 weeks gestation of pregnancy
FETAL EVALUATION:
 Num Of Fetuses:         1
 Fetal Heart Rate(bpm):  140
 Cardiac Activity:       Present
 Presentation:           Cephalic
 Placenta:               Posterior
 AFI Sum(cm)     %Tile       Largest Pocket(cm)
 13.49           51
 RUQ(cm)       RLQ(cm)       LUQ(cm)        LLQ(cm)
BIOPHYSICAL EVALUATION:
 Amniotic F.V:   Within normal limits       F. Tone:        Observed
 F. Movement:    Observed                   Score:          [DATE]
 F. Breathing:   Observed
GESTATIONAL AGE:
 Best:          37w 4d     Det. By:  Early Ultrasound         EDD:   02/18/18
                                     (07/05/17)
ANATOMY:
 Stomach:               Seen                   Bladder:                Seen

[Series 1: us mfm ob limited · 0.25mm/px · 10 acquisitions, 10 frames shown]
[im 1/10]
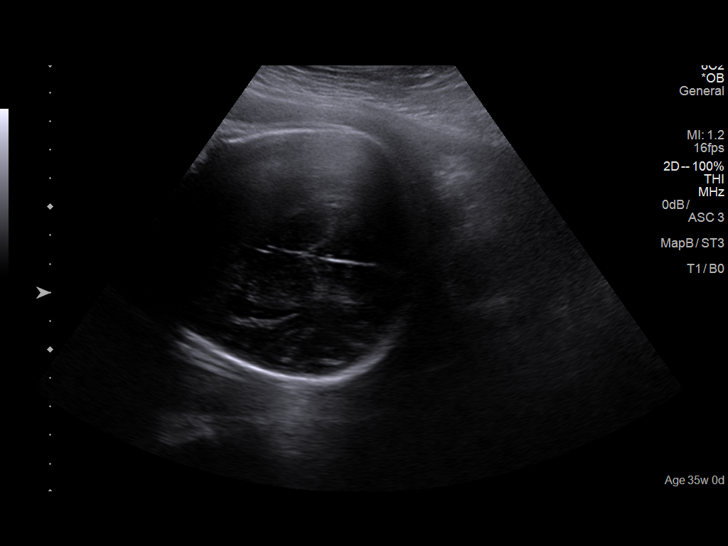
[im 2/10]
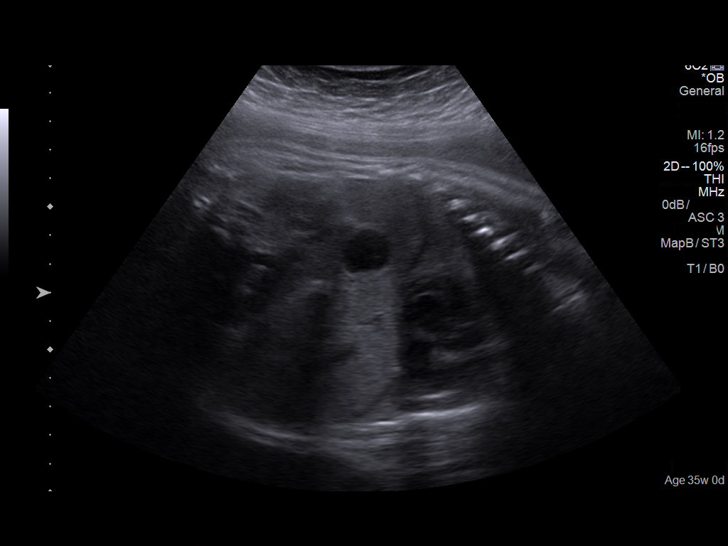
[im 3/10]
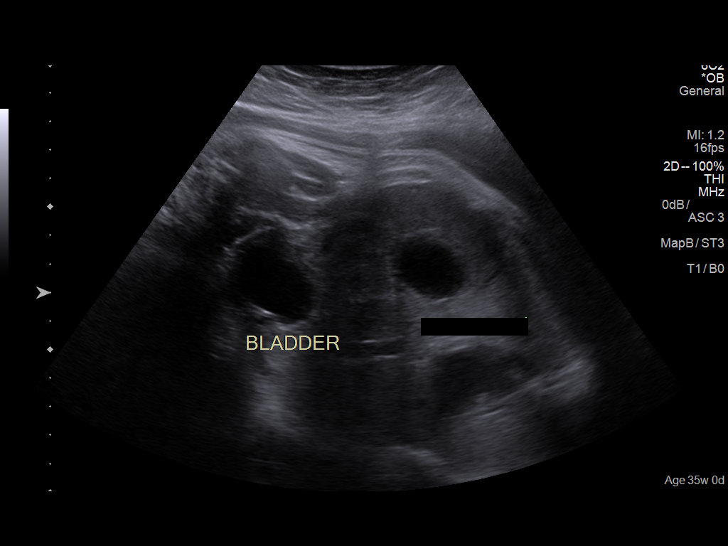
[im 4/10]
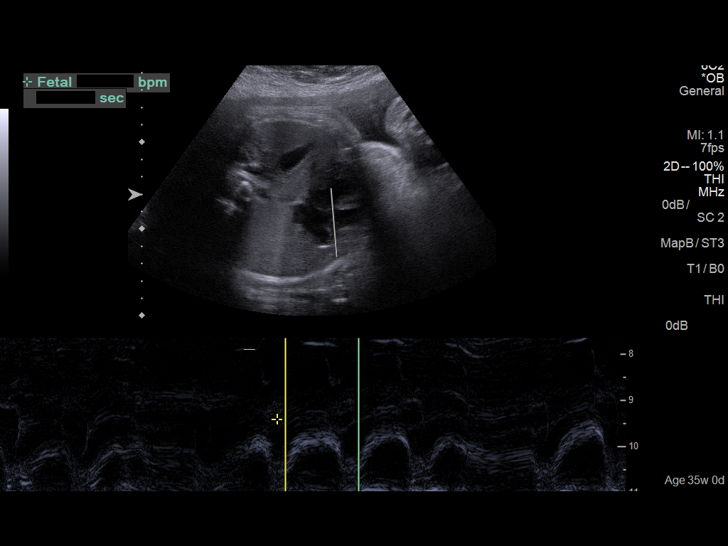
[im 5/10]
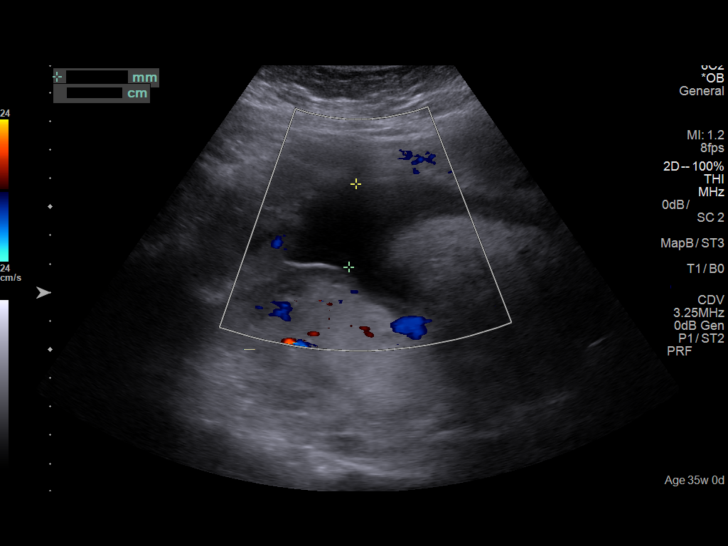
[im 6/10]
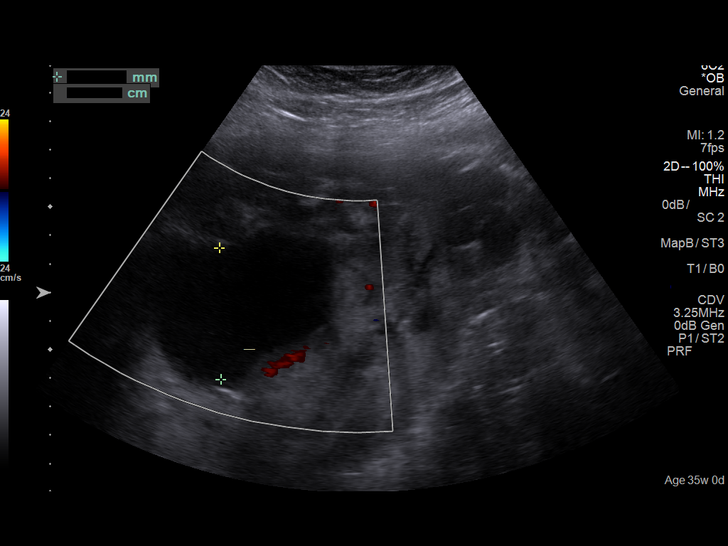
[im 7/10]
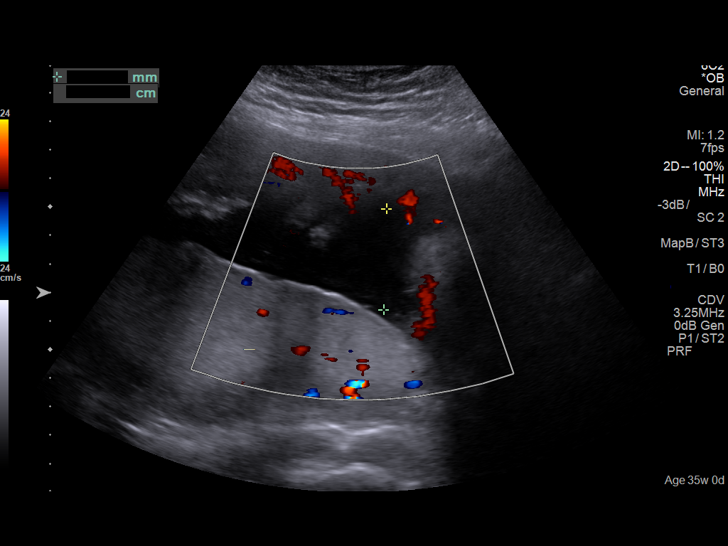
[im 8/10]
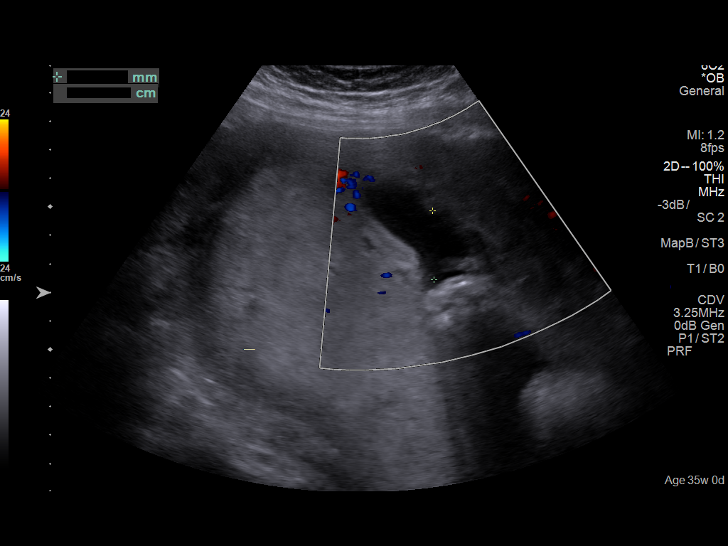
[im 9/10]
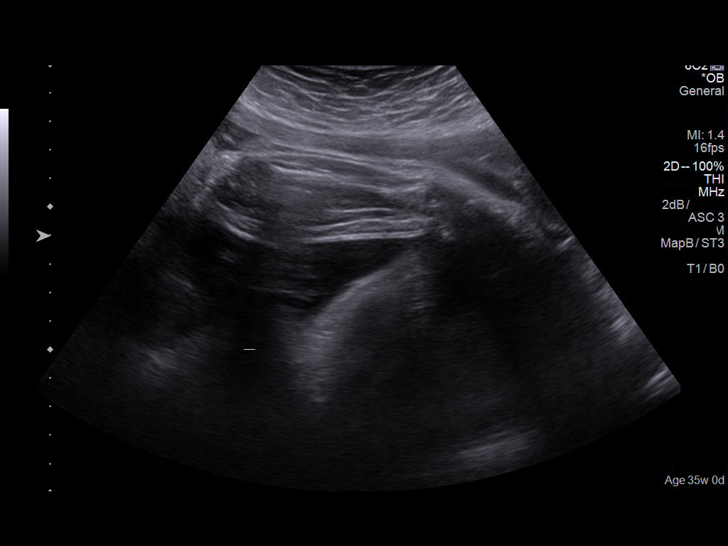
[im 10/10]
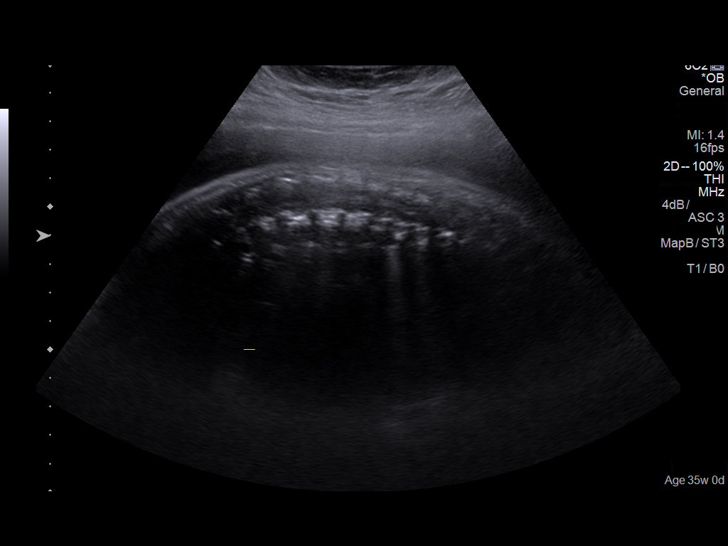

[10 of 10 positions shown; findings below may reference images not displayed]

IMPRESSION: Dear. Dr Yurineth

 Thank you for referring your patient for antental testing/BPP
 due to FHR arrthymia noted on NST performed last week due
 to maternal obesity.  Please see the previous noted by Dr.
 Jim for additional information.  The patient was noted
 to have intermittent PACs last week on m-mode and fetal
 echocardiogram (or neonatal) was recommended and she
 was advised to eliminate dietary caffeine/stimulants.

 BPP today  is [DATE]
 No fetal arrthymia noted.
 Findings were reviewed.
                  Zeblo Pro, [place]

## 2019-02-26 ENCOUNTER — Other Ambulatory Visit: Payer: Self-pay

## 2019-02-26 ENCOUNTER — Ambulatory Visit (INDEPENDENT_AMBULATORY_CARE_PROVIDER_SITE_OTHER): Payer: Medicaid Other | Admitting: Obstetrics and Gynecology

## 2019-02-26 VITALS — BP 112/70 | Wt 282.0 lb

## 2019-02-26 DIAGNOSIS — Z3A17 17 weeks gestation of pregnancy: Secondary | ICD-10-CM

## 2019-02-26 DIAGNOSIS — O99210 Obesity complicating pregnancy, unspecified trimester: Secondary | ICD-10-CM

## 2019-02-26 DIAGNOSIS — O0990 Supervision of high risk pregnancy, unspecified, unspecified trimester: Secondary | ICD-10-CM

## 2019-02-26 DIAGNOSIS — O09299 Supervision of pregnancy with other poor reproductive or obstetric history, unspecified trimester: Secondary | ICD-10-CM

## 2019-02-26 DIAGNOSIS — Z363 Encounter for antenatal screening for malformations: Secondary | ICD-10-CM

## 2019-02-26 DIAGNOSIS — O0992 Supervision of high risk pregnancy, unspecified, second trimester: Secondary | ICD-10-CM

## 2019-02-26 DIAGNOSIS — O99212 Obesity complicating pregnancy, second trimester: Secondary | ICD-10-CM

## 2019-02-26 DIAGNOSIS — O34219 Maternal care for unspecified type scar from previous cesarean delivery: Secondary | ICD-10-CM

## 2019-02-26 DIAGNOSIS — O09292 Supervision of pregnancy with other poor reproductive or obstetric history, second trimester: Secondary | ICD-10-CM

## 2019-02-26 DIAGNOSIS — Z98891 History of uterine scar from previous surgery: Secondary | ICD-10-CM

## 2019-02-26 LAB — POCT URINALYSIS DIPSTICK OB
Glucose, UA: NEGATIVE
POC,PROTEIN,UA: NEGATIVE

## 2019-02-26 NOTE — Progress Notes (Signed)
ROB

## 2019-02-26 NOTE — Progress Notes (Signed)
    Routine Prenatal Care Visit  Subjective  Eileen Parsons is a 29 y.o. [id number] at [redacted]w[redacted]d being seen today for ongoing prenatal care.  She is currently monitored for the following issues for this high-risk pregnancy and has Supervision of high risk pregnancy, antepartum; History of shoulder dystocia in prior pregnancy, currently pregnant; Obesity affecting pregnancy, antepartum; BMI 40.0-44.9, adult (HCC); and History of cesarean delivery on their problem list.  ----------------------------------------------------------------------------------- Patient reports no complaints.   Contractions: Not present. Vag. Bleeding: None.  Movement: Present. Denies leaking of fluid.  ----------------------------------------------------------------------------------- The following portions of the patient's history were reviewed and updated as appropriate: allergies, current medications, past family history, past medical history, past social history, past surgical history and problem list. Problem list updated.   Objective  Blood pressure 112/70, weight 282 lb (127.9 kg), last menstrual period 10/25/2018, not currently breastfeeding. Pregravid weight 275 lb (124.7 kg) Total Weight Gain 7 lb (3.175 kg) Urinalysis:      Fetal Status: Fetal Heart Rate (bpm): 150   Movement: Present     General:  Alert, oriented and cooperative. Patient is in no acute distress.  Skin: Skin is warm and dry. No rash noted.   Cardiovascular: Normal heart rate noted  Respiratory: Normal respiratory effort, no problems with respiration noted  Abdomen: Soft, gravid, appropriate for gestational age. Pain/Pressure: Absent     Pelvic:  Cervical exam deferred        Extremities: Normal range of motion.     ental Status: Normal mood and affect. Normal behavior. Normal judgment and thought content.     Assessment   29 y.o. [id number] at [redacted]w[redacted]d by  08/01/2019, by Last Menstrual Period presenting for routine prenatal visit  Plan    Gestational age appropriate obstetric precautions including but not limited to vaginal bleeding, contractions, leaking of fluid and fetal movement were reviewed in detail with the patient.    Return in about 2 weeks (around 03/12/2019) for ROB and anatomy scan.  Vena Austria, MD, Evern Core [facility], [facility] 02/26/2019, 2:47 PM

## 2019-03-13 ENCOUNTER — Ambulatory Visit (INDEPENDENT_AMBULATORY_CARE_PROVIDER_SITE_OTHER): Payer: Medicaid Other | Admitting: Obstetrics and Gynecology

## 2019-03-13 ENCOUNTER — Encounter: Payer: Self-pay | Admitting: Obstetrics and Gynecology

## 2019-03-13 ENCOUNTER — Other Ambulatory Visit: Payer: Self-pay

## 2019-03-13 ENCOUNTER — Ambulatory Visit (INDEPENDENT_AMBULATORY_CARE_PROVIDER_SITE_OTHER): Payer: Medicaid Other

## 2019-03-13 VITALS — BP 110/72 | Wt 284.0 lb

## 2019-03-13 DIAGNOSIS — O0990 Supervision of high risk pregnancy, unspecified, unspecified trimester: Secondary | ICD-10-CM

## 2019-03-13 DIAGNOSIS — O09299 Supervision of pregnancy with other poor reproductive or obstetric history, unspecified trimester: Secondary | ICD-10-CM

## 2019-03-13 DIAGNOSIS — O99210 Obesity complicating pregnancy, unspecified trimester: Secondary | ICD-10-CM

## 2019-03-13 DIAGNOSIS — O99212 Obesity complicating pregnancy, second trimester: Secondary | ICD-10-CM

## 2019-03-13 DIAGNOSIS — Z3A19 19 weeks gestation of pregnancy: Secondary | ICD-10-CM

## 2019-03-13 DIAGNOSIS — Z1379 Encounter for other screening for genetic and chromosomal anomalies: Secondary | ICD-10-CM

## 2019-03-13 DIAGNOSIS — Z363 Encounter for antenatal screening for malformations: Secondary | ICD-10-CM

## 2019-03-13 DIAGNOSIS — O322XX0 Maternal care for transverse and oblique lie, not applicable or unspecified: Secondary | ICD-10-CM

## 2019-03-13 DIAGNOSIS — O34211 Maternal care for low transverse scar from previous cesarean delivery: Secondary | ICD-10-CM

## 2019-03-13 NOTE — Progress Notes (Signed)
ROB/US C/o fatigue Denies lof, no vb, feeling some flutters

## 2019-03-13 NOTE — Patient Instructions (Signed)

## 2019-03-13 NOTE — Progress Notes (Signed)
    Routine Prenatal Care Visit  Subjective  Eileen Parsons is a 29 y.o. [id number] at [redacted]w[redacted]d being seen today for ongoing prenatal care.  She is currently monitored for the following issues for this high-risk pregnancy and has Supervision of high risk pregnancy, antepartum; History of shoulder dystocia in prior pregnancy, currently pregnant; Obesity affecting pregnancy, antepartum; BMI 40.0-44.9, adult (HCC); and History of cesarean delivery on their problem list.  ----------------------------------------------------------------------------------- Patient reports no complaints.   Contractions: Not present. Vag. Bleeding: None.  Movement: Present. Denies leaking of fluid.  ----------------------------------------------------------------------------------- The following portions of the patient's history were reviewed and updated as appropriate: allergies, current medications, past family history, past medical history, past social history, past surgical history and problem list. Problem list updated.   Objective  Blood pressure 110/72, weight 284 lb (128.8 kg), last menstrual period 10/25/2018, not currently breastfeeding. Pregravid weight 275 lb (124.7 kg) Total Weight Gain 9 lb (4.082 kg) Urinalysis:      Fetal Status: Fetal Heart Rate (bpm): 138   Movement: Present  Presentation: Transverse  General:  Alert, oriented and cooperative. Patient is in no acute distress.  Skin: Skin is warm and dry. No rash noted.   Cardiovascular: Normal heart rate noted  Respiratory: Normal respiratory effort, no problems with respiration noted  Abdomen: Soft, gravid, appropriate for gestational age. Pain/Pressure: Absent     Pelvic:  Cervical exam deferred        Extremities: Normal range of motion.  Edema: None  Mental Status: Normal mood and affect. Normal behavior. Normal judgment and thought content.     Assessment   29 y.o. [id number] at [redacted]w[redacted]d by  08/01/2019, by Last Menstrual Period presenting for routine  prenatal visit  Plan   Pregnancy#5 Problems (from 10/25/18 to present)    Problem Noted Resolved   Supervision of high risk pregnancy, antepartum 07/26/2017 by [name], CNM No   Overview Addendum 03/13/2019 10:35 AM by Natale Milch, MD    Clinic [facility] Prenatal Labs  Dating LMP, confirmed [place] Blood type: O/Positive/-- (01/15 1054)   Genetic Screen  NIPS: Antibody:Negative (01/15 1054)  Anatomic [place]  incomplete Rubella: 1.65 (01/15 1054) Varicella: Immune  GTT Early: 99               28 wk:  RPR: Non Reactive (01/15 1054)   Rhogam n/a HBsAg: Negative (01/15 1054)   Vaccines TDAP:                       Flu Shot: declined HIV: Non Reactive (01/15 1054)   Baby Food Plans breast, bottle fed last two infants                              GBS:   Contraception  Pap:12/2018 normal  CBB  No   CS/VBAC G2 c/s 02/01/2018, desires VBAC   Support Person Rodricus fiance             Gestational age appropriate obstetric precautions including but not limited to vaginal bleeding, contractions, leaking of fluid and fetal movement were reviewed in detail with the patient.    MFM referral to follow up heart views and gender Repeat Maternit21 testing today  Return in about 4 weeks (around 04/10/2019) for ROB in person.  Natale Milch MD [facility], [facility] 03/13/2019, 10:35 AM

## 2019-03-14 ENCOUNTER — Other Ambulatory Visit: Payer: Self-pay | Admitting: Obstetrics and Gynecology

## 2019-03-14 DIAGNOSIS — Z3689 Encounter for other specified antenatal screening: Secondary | ICD-10-CM

## 2019-03-14 NOTE — Telephone Encounter (Signed)
I have called and explained this to the patient.

## 2019-03-15 ENCOUNTER — Ambulatory Visit (INDEPENDENT_AMBULATORY_CARE_PROVIDER_SITE_OTHER): Payer: Medicaid Other

## 2019-03-15 ENCOUNTER — Other Ambulatory Visit: Payer: Self-pay

## 2019-03-15 ENCOUNTER — Other Ambulatory Visit: Payer: Self-pay | Admitting: Obstetrics & Gynecology

## 2019-03-15 ENCOUNTER — Ambulatory Visit (INDEPENDENT_AMBULATORY_CARE_PROVIDER_SITE_OTHER): Payer: Medicaid Other | Admitting: Obstetrics and Gynecology

## 2019-03-15 VITALS — BP 118/72 | Wt 284.0 lb

## 2019-03-15 DIAGNOSIS — Z3A20 20 weeks gestation of pregnancy: Secondary | ICD-10-CM

## 2019-03-15 DIAGNOSIS — O09292 Supervision of pregnancy with other poor reproductive or obstetric history, second trimester: Secondary | ICD-10-CM

## 2019-03-15 DIAGNOSIS — Z3689 Encounter for other specified antenatal screening: Secondary | ICD-10-CM

## 2019-03-15 DIAGNOSIS — O0992 Supervision of high risk pregnancy, unspecified, second trimester: Secondary | ICD-10-CM

## 2019-03-15 DIAGNOSIS — Z362 Encounter for other antenatal screening follow-up: Secondary | ICD-10-CM | POA: Diagnosis not present

## 2019-03-15 DIAGNOSIS — Z0489 Encounter for examination and observation for other specified reasons: Secondary | ICD-10-CM

## 2019-03-15 DIAGNOSIS — IMO0002 Reserved for concepts with insufficient information to code with codable children: Secondary | ICD-10-CM

## 2019-03-15 DIAGNOSIS — O322XX0 Maternal care for transverse and oblique lie, not applicable or unspecified: Secondary | ICD-10-CM

## 2019-03-15 DIAGNOSIS — O09299 Supervision of pregnancy with other poor reproductive or obstetric history, unspecified trimester: Secondary | ICD-10-CM

## 2019-03-15 DIAGNOSIS — O0990 Supervision of high risk pregnancy, unspecified, unspecified trimester: Secondary | ICD-10-CM

## 2019-03-15 DIAGNOSIS — Z98891 History of uterine scar from previous surgery: Secondary | ICD-10-CM

## 2019-03-15 DIAGNOSIS — O99212 Obesity complicating pregnancy, second trimester: Secondary | ICD-10-CM

## 2019-03-15 DIAGNOSIS — O99210 Obesity complicating pregnancy, unspecified trimester: Secondary | ICD-10-CM

## 2019-03-15 DIAGNOSIS — O34211 Maternal care for low transverse scar from previous cesarean delivery: Secondary | ICD-10-CM

## 2019-03-15 NOTE — Progress Notes (Signed)
Routine Prenatal Care Visit  Subjective  Eileen Parsons is a 29 y.o. [id number] at [redacted]w[redacted]d being seen today for ongoing prenatal care.  She is currently monitored for the following issues for this high-risk pregnancy and has Supervision of high risk pregnancy, antepartum; History of shoulder dystocia in prior pregnancy, currently pregnant; Obesity affecting pregnancy, antepartum; BMI 40.0-44.9, adult (HCC); and History of cesarean delivery on their problem list.  ----------------------------------------------------------------------------------- Patient reports no complaints.    .  .   . Denies leaking of fluid.  ----------------------------------------------------------------------------------- The following portions of the patient's history were reviewed and updated as appropriate: allergies, current medications, past family history, past medical history, past social history, past surgical history and problem list. Problem list updated.   Objective  Blood pressure 118/72, weight 284 lb (128.8 kg), last menstrual period 10/25/2018, not currently breastfeeding. Pregravid weight 275 lb (124.7 kg) Total Weight Gain 9 lb (4.082 kg) Urinalysis:      Fetal Status:           General:  Alert, oriented and cooperative. Patient is in no acute distress.  Skin: Skin is warm and dry. No rash noted.   Cardiovascular: Normal heart rate noted  Respiratory: Normal respiratory effort, no problems with respiration noted  Abdomen: Soft, gravid, appropriate for gestational age.       Pelvic:  Cervical exam deferred        Extremities: Normal range of motion.     ental Status: Normal mood and affect. Normal behavior. Normal judgment and thought content.   US OB Follow Up  Result Date: 03/15/2019 Patient Name: Eileen Parsons DOB: Dec 24, 1990 MRN: [id number] ULTRASOUND REPORT Location: [facility] Date of Service: 03/15/2019 Indications: Anatomy follow up ultrasound Findings: Mason Jim intrauterine pregnancy is  visualized with FHR at 145 BPM. Fetal presentation is Transverse. Placenta: anterior. Grade: 1 AFI: subjectively normal. Anatomic survey appears normal.  The DA is seen with limited views. Gender female There is no free peritoneal fluid in the cul de sac. Impression: 1. [redacted]w[redacted]d Viable Singleton Intrauterine pregnancy previously established criteria. 2. Normal Anatomy Scan appears normal. 3. Limited views of the ductal arch. Recommendations: 1.Clinical correlation with the patient's History and Physical Exam. Deanna Artis, RT  There is a singleton gestation with subjectively normal amniotic fluid volume. The fetal biometry correlates with established dating. Detailed evaluation of the fetal anatomy was performed.The fetal anatomical survey appears within normal limits within the resolution of ultrasound as described above.   Given sub-optimal images of the ductal arch will re-image in 4 weeks.   It must be noted that a normal ultrasound is unable to rule out fetal aneuploidy, subtle defects such as small ASD or VDS may also not be visible on imaging.  Vena Austria, MD, Evern Core [facility], [facility] 03/15/2019, 3:39 PM     Assessment   29 y.o. [id number] at [redacted]w[redacted]d by  08/01/2019, by Last Menstrual Period presenting for routine prenatal visit  Plan   Pregnancy#5 Problems (from 10/25/18 to present)    Problem Noted Resolved   Supervision of high risk pregnancy, antepartum 07/26/2017 by [name], CNM No   Overview Addendum 03/13/2019 10:35 AM by Natale Milch, MD    Clinic [facility] Prenatal Labs  Dating LMP, confirmed [place] Blood type: O/Positive/-- (01/15 1054)   Genetic Screen  NIPS: Antibody:Negative (01/15 1054)  Anatomic [place]  incomplete Rubella: 1.65 (01/15 1054) Varicella: Immune  GTT Early: 99  28 wk:  RPR: Non Reactive (01/15 1054)   Rhogam n/a HBsAg: Negative (01/15 1054)   Vaccines TDAP:                       Flu Shot: declined HIV: Non Reactive  (01/15 1054)   Baby Food Plans breast, bottle fed last two infants                              GBS:   Contraception  Pap:12/2018 normal  CBB  No   CS/VBAC G2 c/s 02/01/2018, desires VBAC   Support Person Rodricus fiance              Gestational age appropriate obstetric precautions including but not limited to vaginal bleeding, contractions, leaking of fluid and fetal movement were reviewed in detail with the patient.    Anatomy scan ductal arch suboptimally imaged will re-image in 4 weeks unless able to be completed at [facility] on 3/4.  Remaining cardiac anatomy appears normal, also looked back at images from 2/24.  Unclear why the patient has such a short interval follow up.  Currently fetus is back up.    Return in about 4 weeks (around 04/12/2019) for follow up anatomy scan and ROB.  Malachy Mood, MD, [facility], [facility] 03/15/2019, 3:54 PM

## 2019-03-15 NOTE — Progress Notes (Signed)
ROB  Anatomy/ It is a boy!!

## 2019-03-20 LAB — MATERNIT21 PLUS CORE+SCA
Fetal Fraction: 6
Monosomy X (Turner Syndrome): NOT DETECTED
Result (T21): NEGATIVE
Trisomy 13 (Patau syndrome): NEGATIVE
Trisomy 18 (Edwards syndrome): NEGATIVE
Trisomy 21 (Down syndrome): NEGATIVE
XXX (Triple X Syndrome): NOT DETECTED
XXY (Klinefelter Syndrome): NOT DETECTED
XYY (Jacobs Syndrome): NOT DETECTED

## 2019-03-20 NOTE — Progress Notes (Signed)
Pt aware.

## 2019-03-20 NOTE — Progress Notes (Signed)
Please call and discuss

## 2019-03-21 ENCOUNTER — Ambulatory Visit
Admission: RE | Admit: 2019-03-21 | Discharge: 2019-03-21 | Disposition: A | Discharge status: Home / Self Care | Payer: Medicaid Other | Source: Ambulatory Visit | Attending: Maternal and Fetal Medicine | Admitting: Maternal and Fetal Medicine

## 2019-03-21 ENCOUNTER — Other Ambulatory Visit: Payer: Self-pay

## 2019-03-21 ENCOUNTER — Ambulatory Visit
Admission: RE | Admit: 2019-03-21 | Payer: Medicaid Other | Source: Ambulatory Visit | Attending: Obstetrics and Gynecology | Admitting: Obstetrics and Gynecology

## 2019-03-21 ENCOUNTER — Ambulatory Visit
Admission: RE | Admit: 2019-03-21 | Discharge: 2019-03-21 | Disposition: A | Discharge status: Home / Self Care | Payer: Medicaid Other | Source: Ambulatory Visit | Attending: Obstetrics and Gynecology | Admitting: Obstetrics and Gynecology

## 2019-03-21 DIAGNOSIS — O0990 Supervision of high risk pregnancy, unspecified, unspecified trimester: Secondary | ICD-10-CM

## 2019-03-21 DIAGNOSIS — Z3689 Encounter for other specified antenatal screening: Secondary | ICD-10-CM | POA: Insufficient documentation

## 2019-04-10 ENCOUNTER — Encounter: Payer: Self-pay | Admitting: Obstetrics and Gynecology

## 2019-04-10 ENCOUNTER — Ambulatory Visit (INDEPENDENT_AMBULATORY_CARE_PROVIDER_SITE_OTHER): Payer: Medicaid Other | Admitting: Obstetrics and Gynecology

## 2019-04-10 ENCOUNTER — Other Ambulatory Visit: Payer: Medicaid Other

## 2019-04-10 ENCOUNTER — Other Ambulatory Visit: Payer: Self-pay

## 2019-04-10 VITALS — BP 120/78 | Wt 291.0 lb

## 2019-04-10 DIAGNOSIS — Z3A23 23 weeks gestation of pregnancy: Secondary | ICD-10-CM

## 2019-04-10 DIAGNOSIS — O0990 Supervision of high risk pregnancy, unspecified, unspecified trimester: Secondary | ICD-10-CM

## 2019-04-10 DIAGNOSIS — O34219 Maternal care for unspecified type scar from previous cesarean delivery: Secondary | ICD-10-CM

## 2019-04-10 DIAGNOSIS — O99212 Obesity complicating pregnancy, second trimester: Secondary | ICD-10-CM

## 2019-04-10 DIAGNOSIS — O0992 Supervision of high risk pregnancy, unspecified, second trimester: Secondary | ICD-10-CM

## 2019-04-10 NOTE — Progress Notes (Signed)
ROB  No concerns Denies lof, no vb, Good FM 

## 2019-04-10 NOTE — Progress Notes (Signed)
    Routine Prenatal Care Visit  Subjective  Eileen Parsons is a 29 y.o. [id number] at [redacted]w[redacted]d being seen today for ongoing prenatal care.  She is currently monitored for the following issues for this high-risk pregnancy and has Supervision of high risk pregnancy, antepartum; History of shoulder dystocia in prior pregnancy, currently pregnant; Obesity affecting pregnancy, antepartum; BMI 40.0-44.9, adult (HCC); and History of cesarean delivery on their problem list.  ----------------------------------------------------------------------------------- Patient reports no complaints.   Contractions: Not present. Vag. Bleeding: None.  Movement: Present. Denies leaking of fluid.  ----------------------------------------------------------------------------------- The following portions of the patient's history were reviewed and updated as appropriate: allergies, current medications, past family history, past medical history, past social history, past surgical history and problem list. Problem list updated.   Objective  Blood pressure 120/78, weight 291 lb (132 kg), last menstrual period 10/25/2018, not currently breastfeeding. Pregravid weight 275 lb (124.7 kg) Total Weight Gain 16 lb (7.258 kg) Urinalysis:      Fetal Status: Fetal Heart Rate (bpm): 137 Fundal Height: 34 cm Movement: Present     General:  Alert, oriented and cooperative. Patient is in no acute distress.  Skin: Skin is warm and dry. No rash noted.   Cardiovascular: Normal heart rate noted  Respiratory: Normal respiratory effort, no problems with respiration noted  Abdomen: Soft, gravid, appropriate for gestational age. Pain/Pressure: Absent     Pelvic:  Cervical exam deferred        Extremities: Normal range of motion.  Edema: None  Mental Status: Normal mood and affect. Normal behavior. Normal judgment and thought content.     Assessment   29 y.o. [id number] at [redacted]w[redacted]d by  08/01/2019, by Last Menstrual Period presenting for routine  prenatal visit  Plan   Pregnancy#5 Problems (from 10/25/18 to present)    Problem Noted Resolved   Supervision of high risk pregnancy, antepartum 07/26/2017 by [name], CNM No   Overview Addendum 04/10/2019 10:48 AM by Natale Milch, MD    Clinic [facility] Prenatal Labs  Dating LMP, confirmed [place] Blood type: O/Positive/-- (01/15 1054)   Genetic Screen  NIPS: Antibody:Negative (01/15 1054)  Anatomic [place] complete 03/15/2019 Female gender, suboptimal views of aortic arch Rubella: 1.65 (01/15 1054) Varicella: Immune  GTT Early: 99               28 wk:  RPR: Non Reactive (01/15 1054)   Rhogam n/a HBsAg: Negative (01/15 1054)   Vaccines TDAP:                       Flu Shot: declined HIV: Non Reactive (01/15 1054)   Baby Food Plans breast, bottle fed last two infants                              GBS:   Contraception  Pap:12/2018 normal  CBB  No   CS/VBAC G2 c/s 02/01/2018, desires VBAC   Support Person Rodricus fiance             28 week labs next visit Growth [place] in [redacted] weeks  Gestational age appropriate obstetric precautions including but not limited to vaginal bleeding, contractions, leaking of fluid and fetal movement were reviewed in detail with the patient.    Return in about 4 weeks (around 05/08/2019) for ROB and [place] in person.  Natale Milch MD [facility], [facility] 04/10/2019, 10:48 AM

## 2019-05-08 ENCOUNTER — Other Ambulatory Visit: Payer: Medicaid Other

## 2019-05-08 ENCOUNTER — Ambulatory Visit (INDEPENDENT_AMBULATORY_CARE_PROVIDER_SITE_OTHER): Payer: Medicaid Other | Admitting: Obstetrics and Gynecology

## 2019-05-08 ENCOUNTER — Ambulatory Visit (INDEPENDENT_AMBULATORY_CARE_PROVIDER_SITE_OTHER): Payer: Medicaid Other

## 2019-05-08 ENCOUNTER — Other Ambulatory Visit: Payer: Self-pay

## 2019-05-08 VITALS — BP 118/78 | Wt 296.0 lb

## 2019-05-08 DIAGNOSIS — Z98891 History of uterine scar from previous surgery: Secondary | ICD-10-CM

## 2019-05-08 DIAGNOSIS — Z3A27 27 weeks gestation of pregnancy: Secondary | ICD-10-CM | POA: Diagnosis not present

## 2019-05-08 DIAGNOSIS — O99212 Obesity complicating pregnancy, second trimester: Secondary | ICD-10-CM

## 2019-05-08 DIAGNOSIS — O0992 Supervision of high risk pregnancy, unspecified, second trimester: Secondary | ICD-10-CM

## 2019-05-08 DIAGNOSIS — O09299 Supervision of pregnancy with other poor reproductive or obstetric history, unspecified trimester: Secondary | ICD-10-CM

## 2019-05-08 DIAGNOSIS — O99210 Obesity complicating pregnancy, unspecified trimester: Secondary | ICD-10-CM

## 2019-05-08 DIAGNOSIS — O0990 Supervision of high risk pregnancy, unspecified, unspecified trimester: Secondary | ICD-10-CM

## 2019-05-08 DIAGNOSIS — O09292 Supervision of pregnancy with other poor reproductive or obstetric history, second trimester: Secondary | ICD-10-CM

## 2019-05-08 LAB — POCT URINALYSIS DIPSTICK OB: Glucose, UA: NEGATIVE

## 2019-05-08 NOTE — Progress Notes (Signed)
Routine Prenatal Care Visit  Subjective  Eileen Parsons is a 29 y.o. [id number] at [redacted]w[redacted]d being seen today for ongoing prenatal care.  She is currently monitored for the following issues for this high-risk pregnancy and has Supervision of high risk pregnancy, antepartum; History of shoulder dystocia in prior pregnancy, currently pregnant; Obesity affecting pregnancy, antepartum; BMI 40.0-44.9, adult ([place]); and History of cesarean delivery on their problem list.  ----------------------------------------------------------------------------------- Patient reports no complaints.   Contractions: Not present. Vag. Bleeding: None.  Movement: Present. Denies leaking of fluid.  ----------------------------------------------------------------------------------- The following portions of the patient's history were reviewed and updated as appropriate: allergies, current medications, past family history, past medical history, past social history, past surgical history and problem list. Problem list updated.   Objective  Blood pressure 118/78, weight 296 lb (134.3 kg), last menstrual period 10/25/2018, not currently breastfeeding. Pregravid weight 275 lb (124.7 kg) Total Weight Gain 21 lb (9.526 kg) Urinalysis:      Fetal Status: Fetal Heart Rate (bpm): 145 Fundal Height: 30 cm Movement: Present     General:  Alert, oriented and cooperative. Patient is in no acute distress.  Skin: Skin is warm and dry. No rash noted.   Cardiovascular: Normal heart rate noted  Respiratory: Normal respiratory effort, no problems with respiration noted  Abdomen: Soft, gravid, appropriate for gestational age. Pain/Pressure: Absent     Pelvic:  Cervical exam deferred        Extremities: Normal range of motion.     ental Status: Normal mood and affect. Normal behavior. Normal judgment and thought content.   US OB Follow Up  Result Date: 05/08/2019 Patient Name: Eileen Parsons DOB: 04/13/1990 MRN: [id number] ULTRASOUND REPORT  Location: [facility] Date of Service: 05/08/2019 Indications:growth/afi Findings: Nelda [place]se[place] intrauterine pregnancy is visualized with FHR at 132 BPM. Biometrics give an (U/S) Gestational age of [redacted]w[redacted]d and an (U/S) EDD of 08/02/2019; this correlates with the clinically established Estimated Date of Delivery: 08/01/19. Fetal presentation is Cephalic. Placenta: anterior. Grade: 1 AFI: 17.4 cm Growth percentile is 47.1%.  AC percentile is 23.8%. EFW: 1100g  ( 2 lb 7 oz ) Impression: 1. [redacted]w[redacted]d Viable Singleton Intrauterine pregnancy previously established criteria. 2. Growth is 47.1 %ile.  AFI is 17.4 cm. Recommendations: 1.Clinical correlation with the patient's History and Physical Exam. Gweneth Dimitri, RT There is a singleton gestation with normal amniotic fluid volume. The fetal biometry correlates with established dating.  Limited fetal anatomy was performed.The visualized fetal anatomical survey appears within normal limits within the resolution of ultrasound as described above.  It must be noted that a normal ultrasound is unable to rule out fetal aneuploidy.  Malachy Mood, MD, [facility], [facility] 05/08/2019, 9:16 AM     Assessment   29 y.o. [id number] at [redacted]w[redacted]d by  08/01/2019, by Last Menstrual Period presenting for routine prenatal visit  Plan   Pregnancy#5 Problems (from 10/25/18 to present)    Problem Noted Resolved   Supervision of high risk pregnancy, antepartum 07/26/2017 by Dalia Heading, CNM No   Overview Addendum 04/10/2019 10:48 AM by Homero Fellers, [place] Prenatal Labs  Dating LMP, confirmed [place] Blood type: O/Positive/-- (01/15 1054)   Genetic Screen  NIPS: normal XY Antibody:Negative (01/15 1054)  Anatomic [place] complete 03/15/2019 Female gender, suboptimal views of aortic arch Rubella: 1.65 (01/15 1054) Varicella: Immune  GTT Early: 99               28 wk:  RPR: Non Reactive (01/15 1054)   Rhogam n/a HBsAg: Negative (01/15 1054)     Vaccines TDAP:                       Flu Shot: declined HIV: Non Reactive (01/15 1054)   Baby Food Plans breast, bottle fed last two infants                              GBS:   Contraception  Pap:12/2018 normal  CBB  No   CS/VBAC G2 c/s 02/01/2018, desires VBAC   Support Person Rodricus fiance              Gestational age appropriate obstetric precautions including but not limited to vaginal bleeding, contractions, leaking of fluid and fetal movement were reviewed in detail with the patient.    - 28 week labs today - Normal growth scan  Return in about 2 weeks (around 05/22/2019) for ROB.  Vena Austria, MD, [facility], [facility] 05/08/2019, 9:31 AM

## 2019-05-09 LAB — 28 WEEK RH+PANEL
Basophils Absolute: 0 10*3/uL (ref 0.0–0.2)
Basos: 0 %
EOS (ABSOLUTE): 0.2 10*3/uL (ref 0.0–0.4)
Eos: 2 %
Gestational Diabetes Screen: 115 mg/dL (ref 65–139)
HIV Screen 4th Generation wRfx: NONREACTIVE
Hematocrit: 37.5 % (ref 34.0–46.6)
Hemoglobin: 12.6 g/dL (ref 11.1–15.9)
Immature Grans (Abs): 0.1 10*3/uL (ref 0.0–0.1)
Immature Granulocytes: 1 %
Lymphocytes Absolute: 3 10*3/uL (ref 0.7–3.1)
Lymphs: 26 %
MCH: 28 pg (ref 26.6–33.0)
MCHC: 33.6 g/dL (ref 31.5–35.7)
MCV: 83 fL (ref 79–97)
Monocytes Absolute: 0.6 10*3/uL (ref 0.1–0.9)
Monocytes: 5 %
Neutrophils Absolute: 7.6 10*3/uL — ABNORMAL HIGH (ref 1.4–7.0)
Neutrophils: 66 %
Platelets: 385 10*3/uL (ref 150–450)
RBC: 4.5 x10E6/uL (ref 3.77–5.28)
RDW: 13.6 % (ref 11.7–15.4)
RPR Ser Ql: NONREACTIVE
WBC: 11.6 10*3/uL — ABNORMAL HIGH (ref 3.4–10.8)

## 2019-05-24 ENCOUNTER — Ambulatory Visit (INDEPENDENT_AMBULATORY_CARE_PROVIDER_SITE_OTHER): Payer: Medicaid Other | Admitting: Obstetrics and Gynecology

## 2019-05-24 ENCOUNTER — Other Ambulatory Visit: Payer: Self-pay

## 2019-05-24 ENCOUNTER — Encounter: Payer: Self-pay | Admitting: Obstetrics and Gynecology

## 2019-05-24 VITALS — BP 118/78 | Wt 296.0 lb

## 2019-05-24 DIAGNOSIS — O0993 Supervision of high risk pregnancy, unspecified, third trimester: Secondary | ICD-10-CM

## 2019-05-24 DIAGNOSIS — Z3A30 30 weeks gestation of pregnancy: Secondary | ICD-10-CM

## 2019-05-24 DIAGNOSIS — Z98891 History of uterine scar from previous surgery: Secondary | ICD-10-CM

## 2019-05-24 DIAGNOSIS — O99210 Obesity complicating pregnancy, unspecified trimester: Secondary | ICD-10-CM

## 2019-05-24 LAB — POCT URINALYSIS DIPSTICK OB
Glucose, UA: NEGATIVE
POC,PROTEIN,UA: NEGATIVE

## 2019-05-24 NOTE — Patient Instructions (Signed)
Third Trimester of Pregnancy The third trimester is from week 28 through week 40 (months 7 through 9). The third trimester is a time when the unborn baby (fetus) is growing rapidly. At the end of the ninth month, the fetus is about 20 inches in length and weighs 6-10 pounds. Body changes during your third trimester Your body will continue to go through many changes during pregnancy. The changes vary from woman to woman. During the third trimester:  Your weight will continue to increase. You can expect to gain 25-35 pounds (11-16 kg) by the end of the pregnancy.  You may begin to get stretch marks on your hips, abdomen, and breasts.  You may urinate more often because the fetus is moving lower into your pelvis and pressing on your bladder.  You may develop or continue to have heartburn. This is caused by increased hormones that slow down muscles in the digestive tract.  You may develop or continue to have constipation because increased hormones slow digestion and cause the muscles that push waste through your intestines to relax.  You may develop hemorrhoids. These are swollen veins (varicose veins) in the rectum that can itch or be painful.  You may develop swollen, bulging veins (varicose veins) in your legs.  You may have increased body aches in the pelvis, back, or thighs. This is due to weight gain and increased hormones that are relaxing your joints.  You may have changes in your hair. These can include thickening of your hair, rapid growth, and changes in texture. Some women also have hair loss during or after pregnancy, or hair that feels dry or thin. Your hair will most likely return to normal after your baby is born.  Your breasts will continue to grow and they will continue to become tender. A yellow fluid (colostrum) may leak from your breasts. This is the first milk you are producing for your baby.  Your belly button may stick out.  You may notice more swelling in your hands,  face, or ankles.  You may have increased tingling or numbness in your hands, arms, and legs. The skin on your belly may also feel numb.  You may feel short of breath because of your expanding uterus.  You may have more problems sleeping. This can be caused by the size of your belly, increased need to urinate, and an increase in your body's metabolism.  You may notice the fetus "dropping," or moving lower in your abdomen (lightening).  You may have increased vaginal discharge.  You may notice your joints feel loose and you may have pain around your pelvic bone. What to expect at prenatal visits You will have prenatal exams every 2 weeks until week 36. Then you will have weekly prenatal exams. During a routine prenatal visit:  You will be weighed to make sure you and the baby are growing normally.  Your blood pressure will be taken.  Your abdomen will be measured to track your baby's growth.  The fetal heartbeat will be listened to.  Any test results from the previous visit will be discussed.  You may have a cervical check near your due date to see if your cervix has softened or thinned (effaced).  You will be tested for Group B streptococcus. This happens between 35 and 37 weeks. Your health care provider may ask you:  What your birth plan is.  How you are feeling.  If you are feeling the baby move.  If you have had any abnormal   symptoms, such as leaking fluid, bleeding, severe headaches, or abdominal cramping.  If you are using any tobacco products, including cigarettes, chewing tobacco, and electronic cigarettes.  If you have any questions. Other tests or screenings that may be performed during your third trimester include:  Blood tests that check for low iron levels (anemia).  Fetal testing to check the health, activity level, and growth of the fetus. Testing is done if you have certain medical conditions or if there are problems during the pregnancy.  Nonstress test  (NST). This test checks the health of your baby to make sure there are no signs of problems, such as the baby not getting enough oxygen. During this test, a belt is placed around your belly. The baby is made to move, and its heart rate is monitored during movement. What is false labor? False labor is a condition in which you feel small, irregular tightenings of the muscles in the womb (contractions) that usually go away with rest, changing position, or drinking water. These are called Braxton Hicks contractions. Contractions may last for hours, days, or even weeks before true labor sets in. If contractions come at regular intervals, become more frequent, increase in intensity, or become painful, you should see your health care provider. What are the signs of labor?  Abdominal cramps.  Regular contractions that start at 10 minutes apart and become stronger and more frequent with time.  Contractions that start on the top of the uterus and spread down to the lower abdomen and back.  Increased pelvic pressure and dull back pain.  A watery or bloody mucus discharge that comes from the vagina.  Leaking of amniotic fluid. This is also known as your "water breaking." It could be a slow trickle or a gush. Let your health care provider know if it has a color or strange odor. If you have any of these signs, call your health care provider right away, even if it is before your due date. Follow these instructions at home: Medicines  Follow your health care provider's instructions regarding medicine use. Specific medicines may be either safe or unsafe to take during pregnancy.  Take a prenatal vitamin that contains at least 600 micrograms (mcg) of folic acid.  If you develop constipation, try taking a stool softener if your health care provider approves. Eating and drinking   Eat a balanced diet that includes fresh fruits and vegetables, whole grains, good sources of protein such as meat, eggs, or tofu,  and low-fat dairy. Your health care provider will help you determine the amount of weight gain that is right for you.  Avoid raw meat and uncooked cheese. These carry germs that can cause birth defects in the baby.  If you have low calcium intake from food, talk to your health care provider about whether you should take a daily calcium supplement.  Eat four or five small meals rather than three large meals a day.  Limit foods that are high in fat and processed sugars, such as fried and sweet foods.  To prevent constipation: ? Drink enough fluid to keep your urine clear or pale yellow. ? Eat foods that are high in fiber, such as fresh fruits and vegetables, whole grains, and beans. Activity  Exercise only as directed by your health care provider. Most women can continue their usual exercise routine during pregnancy. Try to exercise for 30 minutes at least 5 days a week. Stop exercising if you experience uterine contractions.  Avoid heavy lifting.  Do   not exercise in extreme heat or humidity, or at high altitudes.  Wear low-heel, comfortable shoes.  Practice good posture.  You may continue to have sex unless your health care provider tells you otherwise. Relieving pain and discomfort  Take frequent breaks and rest with your legs elevated if you have leg cramps or low back pain.  Take warm sitz baths to soothe any pain or discomfort caused by hemorrhoids. Use hemorrhoid cream if your health care provider approves.  Wear a good support bra to prevent discomfort from breast tenderness.  If you develop varicose veins: ? Wear support pantyhose or compression stockings as told by your healthcare provider. ? Elevate your feet for 15 minutes, 3-4 times a day. Prenatal care  Write down your questions. Take them to your prenatal visits.  Keep all your prenatal visits as told by your health care provider. This is important. Safety  Wear your seat belt at all times when driving.  Make  a list of emergency phone numbers, including numbers for family, friends, the hospital, and police and fire departments. General instructions  Avoid cat litter boxes and soil used by cats. These carry germs that can cause birth defects in the baby. If you have a cat, ask someone to clean the litter box for you.  Do not travel far distances unless it is absolutely necessary and only with the approval of your health care provider.  Do not use hot tubs, steam rooms, or saunas.  Do not drink alcohol.  Do not use any products that contain nicotine or tobacco, such as cigarettes and e-cigarettes. If you need help quitting, ask your health care provider.  Do not use any medicinal herbs or unprescribed drugs. These chemicals affect the formation and growth of the baby.  Do not douche or use tampons or scented sanitary pads.  Do not cross your legs for long periods of time.  To prepare for the arrival of your baby: ? Take prenatal classes to understand, practice, and ask questions about labor and delivery. ? Make a trial run to the hospital. ? Visit the hospital and tour the maternity area. ? Arrange for maternity or paternity leave through employers. ? Arrange for family and friends to take care of pets while you are in the hospital. ? Purchase a rear-facing car seat and make sure you know how to install it in your car. ? Pack your hospital bag. ? Prepare the baby's nursery. Make sure to remove all pillows and stuffed animals from the baby's crib to prevent suffocation.  Visit your dentist if you have not gone during your pregnancy. Use a soft toothbrush to brush your teeth and be gentle when you floss. Contact a health care provider if:  You are unsure if you are in labor or if your water has broken.  You become dizzy.  You have mild pelvic cramps, pelvic pressure, or nagging pain in your abdominal area.  You have lower back pain.  You have persistent nausea, vomiting, or  diarrhea.  You have an unusual or bad smelling vaginal discharge.  You have pain when you urinate. Get help right away if:  Your water breaks before 37 weeks.  You have regular contractions less than 5 minutes apart before 37 weeks.  You have a fever.  You are leaking fluid from your vagina.  You have spotting or bleeding from your vagina.  You have severe abdominal pain or cramping.  You have rapid weight loss or weight gain.  You have   shortness of breath with chest pain.  You notice sudden or extreme swelling of your face, hands, ankles, feet, or legs.  Your baby makes fewer than 10 movements in 2 hours.  You have severe headaches that do not go away when you take medicine.  You have vision changes. Summary  The third trimester is from week 28 through week 40, months 7 through 9. The third trimester is a time when the unborn baby (fetus) is growing rapidly.  During the third trimester, your discomfort may increase as you and your baby continue to gain weight. You may have abdominal, leg, and back pain, sleeping problems, and an increased need to urinate.  During the third trimester your breasts will keep growing and they will continue to become tender. A yellow fluid (colostrum) may leak from your breasts. This is the first milk you are producing for your baby.  False labor is a condition in which you feel small, irregular tightenings of the muscles in the womb (contractions) that eventually go away. These are called Braxton Hicks contractions. Contractions may last for hours, days, or even weeks before true labor sets in.  Signs of labor can include: abdominal cramps; regular contractions that start at 10 minutes apart and become stronger and more frequent with time; watery or bloody mucus discharge that comes from the vagina; increased pelvic pressure and dull back pain; and leaking of amniotic fluid. This information is not intended to replace advice given to you by your  health care provider. Make sure you discuss any questions you have with your health care provider. Document Revised: 04/26/2018 Document Reviewed: 02/09/2016 Elsevier Patient Education  2020 [employer]  

## 2019-05-24 NOTE — Progress Notes (Signed)
Routine Prenatal Care Visit  Subjective  Eileen Parsons is a 29 y.o. [id number] at [redacted]w[redacted]d being seen today for ongoing prenatal care.  She is currently monitored for the following issues for this high-risk pregnancy and has Supervision of high risk pregnancy, antepartum; History of shoulder dystocia in prior pregnancy, currently pregnant; Obesity affecting pregnancy, antepartum; BMI 40.0-44.9, adult ([place]); and History of cesarean delivery on their problem list.  ----------------------------------------------------------------------------------- Patient reports new onset tooth pain.   Contractions: Not present. Vag. Bleeding: None.  Movement: Present. Denies leaking of fluid.  ----------------------------------------------------------------------------------- The following portions of the patient's history were reviewed and updated as appropriate: allergies, current medications, past family history, past medical history, past social history, past surgical history and problem list. Problem list updated.   Objective  Blood pressure 118/78, weight 296 lb (134.3 kg), last menstrual period 10/25/2018, not currently breastfeeding. Pregravid weight 275 lb (124.7 kg) Total Weight Gain 21 lb (9.526 kg) Urinalysis:      Fetal Status: Fetal Heart Rate (bpm): 140 Fundal Height: 53 cm Movement: Present     General:  Alert, oriented and cooperative. Patient is in no acute distress.  Skin: Skin is warm and dry. No rash noted.   Cardiovascular: Normal heart rate noted  Respiratory: Normal respiratory effort, no problems with respiration noted  Abdomen: Soft, gravid, appropriate for gestational age. Pain/Pressure: Absent     Pelvic:  Cervical exam deferred        Extremities: Normal range of motion.  Edema: None  Mental Status: Normal mood and affect. Normal behavior. Normal judgment and thought content.     Assessment   29 y.o. [id number] at [redacted]w[redacted]d by  08/01/2019, by Last Menstrual Period presenting for  routine prenatal visit  Plan   Pregnancy#5 Problems (from 10/25/18 to present)    Problem Noted Resolved   Obesity affecting pregnancy, antepartum 01/17/2019 by Rod Can, CNM No   Overview Signed 05/08/2019  9:33 AM by Malachy Mood, MD    Growth [X]  28 weeks 1100g or 2lbs 7oz c/w 47.1%ile [ ]  32 weeks [ ]  36 weeks      Supervision of high risk pregnancy, antepartum 07/26/2017 by Dalia Heading, CNM No   Overview Addendum 05/24/2019 11:59 AM by Homero Fellers, MD    Clinic [facility] Prenatal Labs  Dating LMP = 14 week [place] Blood type: O/Positive/-- (01/15 1054)   Genetic Screen  NIPS: normal XY Antibody:Negative (01/15 1054)  Anatomic [place] complete 03/15/2019 Female gender, suboptimal views of aortic arch Rubella: 1.65 (01/15 1054) Varicella: Immune  GTT Early: 99               28 wk:  RPR: Non Reactive (01/15 1054)   Rhogam n/a HBsAg: Negative (01/15 1054)   Vaccines TDAP:   Would like at 32 wk visit                    Flu Shot: declined HIV: Non Reactive (01/15 1054)   Baby Food Plans breast, bottle fed last two infants                              GBS:   Contraception  Pap:12/2018 normal  CBB  No Pelvis tested: 8lbs 11oz  CS/VBAC G2 c/s 02/01/2018, desires VBAC   Support Person Rodricus fiance             Discussed lactation and labor classes Discussed vaccination for TDAP and COVID.  Patient would like to wait until next week for vaccine Discussed tooth pain- recommended to see her dentist ASAP. Growth [place] next visit.  Gestational age appropriate obstetric precautions including but not limited to vaginal bleeding, contractions, leaking of fluid and fetal movement were reviewed in detail with the patient.    Return in about 2 weeks (around 06/07/2019) for ROB in person and gorwth/afi [place].  Natale Milch MD [facility], [facility] 05/24/2019, 12:01 PM

## 2019-06-07 ENCOUNTER — Other Ambulatory Visit: Payer: Self-pay

## 2019-06-07 ENCOUNTER — Ambulatory Visit (INDEPENDENT_AMBULATORY_CARE_PROVIDER_SITE_OTHER): Payer: Medicaid Other | Admitting: Obstetrics and Gynecology

## 2019-06-07 ENCOUNTER — Ambulatory Visit (INDEPENDENT_AMBULATORY_CARE_PROVIDER_SITE_OTHER): Payer: Medicaid Other

## 2019-06-07 ENCOUNTER — Encounter: Payer: Self-pay | Admitting: Obstetrics and Gynecology

## 2019-06-07 DIAGNOSIS — O99210 Obesity complicating pregnancy, unspecified trimester: Secondary | ICD-10-CM

## 2019-06-07 DIAGNOSIS — O0993 Supervision of high risk pregnancy, unspecified, third trimester: Secondary | ICD-10-CM

## 2019-06-07 DIAGNOSIS — Z23 Encounter for immunization: Secondary | ICD-10-CM

## 2019-06-07 DIAGNOSIS — O0990 Supervision of high risk pregnancy, unspecified, unspecified trimester: Secondary | ICD-10-CM

## 2019-06-07 LAB — POCT URINALYSIS DIPSTICK OB
Glucose, UA: NEGATIVE
POC,PROTEIN,UA: NEGATIVE

## 2019-06-07 NOTE — Patient Instructions (Signed)

## 2019-06-07 NOTE — Progress Notes (Signed)
Routine Prenatal Care Visit  Subjective  Makenley Kopke is a 29 y.o. [id number] at [redacted]w[redacted]d being seen today for ongoing prenatal care.  She is currently monitored for the following issues for this high-risk pregnancy and has Supervision of high risk pregnancy, antepartum; History of shoulder dystocia in prior pregnancy, currently pregnant; Obesity affecting pregnancy, antepartum; BMI 40.0-44.9, adult (HCC); and History of cesarean delivery on their problem list.  ----------------------------------------------------------------------------------- Patient reports hip pain in bed at night.  Occasional braxton hicks contractions.  Contractions: Not present. Vag. Bleeding: None.  Movement: Present. Denies leaking of fluid.  ----------------------------------------------------------------------------------- The following portions of the patient's history were reviewed and updated as appropriate: allergies, current medications, past family history, past medical history, past social history, past surgical history and problem list. Problem list updated.   Objective  Blood pressure 120/80, weight 296 lb (134.3 kg), last menstrual period 10/25/2018, not currently breastfeeding. Pregravid weight 275 lb (124.7 kg) Total Weight Gain 21 lb (9.526 kg) Urinalysis:      Fetal Status: Fetal Heart Rate (bpm): 140 Fundal Height: 37 cm Movement: Present     General:  Alert, oriented and cooperative. Patient is in no acute distress.  Skin: Skin is warm and dry. No rash noted.   Cardiovascular: Normal heart rate noted  Respiratory: Normal respiratory effort, no problems with respiration noted  Abdomen: Soft, gravid, appropriate for gestational age. Pain/Pressure: Present     Pelvic:  Cervical exam deferred        Extremities: Normal range of motion.  Edema: None  Mental Status: Normal mood and affect. Normal behavior. Normal judgment and thought content.     Assessment   29 y.o. [id number] at [redacted]w[redacted]d by  08/01/2019,  by Last Menstrual Period presenting for routine prenatal visit  Plan   Pregnancy#5 Problems (from 10/25/18 to present)    Problem Noted Resolved   Obesity affecting pregnancy, antepartum 01/17/2019 by Tresea Mall, CNM No   Overview Addendum 06/07/2019 11:40 AM by Natale Milch, MD    Growth [X]  28 weeks 1100g or 2lbs 7oz c/w 47.1%ile [x ] 32 weeks  Growth percentile is 39.5%.  BPD 5%. HC 3.4% EFW: 1821 g  ( 4 lb 0 oz ) [ ]  36 weeks      Supervision of high risk pregnancy, antepartum 07/26/2017 by , CNM No   Overview Addendum 06/07/2019 11:38 AM by [name], MD    [facility] Prenatal Labs  Dating LMP = 14 week 06/09/2019 Blood type: O/Positive/-- (01/15 1054)   Genetic Screen  NIPS: normal XY Antibody:Negative (01/15 1054)  Anatomic [birth year]1984 complete 03/15/2019 Female gender, suboptimal views of aortic arch Rubella: 1.65 (01/15 1054) Varicella: Immune  GTT Early: 99               28 wk:  RPR: Non Reactive (01/15 1054)   Rhogam n/a HBsAg: Negative (01/15 1054)   Vaccines TDAP:   06/07/2019         Flu Shot: declined HIV: Non Reactive (01/15 1054)   Baby Food Plans breast, bottle fed last two infants                              GBS:   Contraception  Pap:12/2018 normal  CBB  No Pelvis tested: 8lbs 11oz  CS/VBAC G2 c/s 02/01/2018, desires VBAC   Support Person Rodricus fiance  Gestational age appropriate obstetric precautions including but not limited to vaginal bleeding, contractions, leaking of fluid and fetal movement were reviewed in detail with the patient.    Return in about 2 weeks (around 06/21/2019) for ROB in person.  Homero Fellers MD [facility], [facility] 06/07/2019, 11:40 AM

## 2019-06-21 ENCOUNTER — Ambulatory Visit (INDEPENDENT_AMBULATORY_CARE_PROVIDER_SITE_OTHER): Payer: Medicaid Other | Admitting: Obstetrics and Gynecology

## 2019-06-21 ENCOUNTER — Encounter: Payer: Self-pay | Admitting: Obstetrics and Gynecology

## 2019-06-21 ENCOUNTER — Other Ambulatory Visit: Payer: Self-pay

## 2019-06-21 VITALS — BP 130/70 | Ht 67.0 in | Wt 298.4 lb

## 2019-06-21 DIAGNOSIS — O99210 Obesity complicating pregnancy, unspecified trimester: Secondary | ICD-10-CM

## 2019-06-21 DIAGNOSIS — O0990 Supervision of high risk pregnancy, unspecified, unspecified trimester: Secondary | ICD-10-CM

## 2019-06-21 DIAGNOSIS — Z3A34 34 weeks gestation of pregnancy: Secondary | ICD-10-CM

## 2019-06-21 LAB — POCT URINALYSIS DIPSTICK OB: Glucose, UA: NEGATIVE

## 2019-06-21 NOTE — Progress Notes (Signed)
Routine Prenatal Care Visit  Subjective  Jessie Winborne is a 29 y.o. [id number] at [redacted]w[redacted]d being seen today for ongoing prenatal care.  She is currently monitored for the following issues for this high-risk pregnancy and has Supervision of high risk pregnancy, antepartum; History of shoulder dystocia in prior pregnancy, currently pregnant; Obesity affecting pregnancy, antepartum; BMI 40.0-44.9, adult (HCC); and History of cesarean delivery on their problem list.  ----------------------------------------------------------------------------------- Patient reports no complaints.   Contractions: Not present. Vag. Bleeding: None.  Movement: Present. Denies leaking of fluid.  ----------------------------------------------------------------------------------- The following portions of the patient's history were reviewed and updated as appropriate: allergies, current medications, past family history, past medical history, past social history, past surgical history and problem list. Problem list updated.   Objective  Blood pressure 130/70, height 5\' 7"  (1.702 m), weight 298 lb 6.4 oz (135.4 kg), last menstrual period 10/25/2018, not currently breastfeeding. Pregravid weight 275 lb (124.7 kg) Total Weight Gain 23 lb 6.4 oz (10.6 kg) Urinalysis:      Fetal Status: Fetal Heart Rate (bpm): 135   Movement: Present     General:  Alert, oriented and cooperative. Patient is in no acute distress.  Skin: Skin is warm and dry. No rash noted.   Cardiovascular: Normal heart rate noted  Respiratory: Normal respiratory effort, no problems with respiration noted  Abdomen: Soft, gravid, appropriate for gestational age. Pain/Pressure: Absent     Pelvic:  Cervical exam deferred        Extremities: Normal range of motion.  Edema: None  Mental Status: Normal mood and affect. Normal behavior. Normal judgment and thought content.     Assessment   29 y.o. 26 at [redacted]w[redacted]d by  08/01/2019, by Last Menstrual Period  presenting for routine prenatal visit  Plan   Pregnancy#5 Problems (from 10/25/18 to present)    Problem Noted Resolved   Obesity affecting pregnancy, antepartum 01/17/2019 by 01/19/2019, CNM No   Overview Addendum 06/07/2019 11:40 AM by 06/09/2019, MD    Growth [X]  28 weeks 1100g or 2lbs 7oz c/w 47.1%ile [x ] 32 weeks  Growth percentile is 39.5%.  BPD 5%. HC 3.4% EFW: 1821 g  ( 4 lb 0 oz ) [ ]  36 weeks      Supervision of high risk pregnancy, antepartum 07/26/2017 by , CNM No   Overview Addendum 06/07/2019 11:38 AM by 09/26/2017, MD    [facility] Prenatal [facility]  Dating LMP = 14 week [name] Blood type: O/Positive/-- (01/15 1054)   Genetic Screen  NIPS: normal XY Antibody:Negative (01/15 1054)  Anatomic [place] complete 03/15/2019 Female gender, suboptimal views of aortic arch Rubella: 1.65 (01/15 1054) Varicella: Immune  GTT Early: 99               28 wk:  RPR: Non Reactive (01/15 1054)   Rhogam n/a HBsAg: Negative (01/15 1054)   Vaccines TDAP:   06/07/2019         Flu Shot: declined HIV: Non Reactive (01/15 1054)   Baby Food Plans breast, bottle fed last two infants                              GBS:   Contraception  Pap:12/2018 normal  CBB  No Pelvis tested: 8lbs 11oz  CS/VBAC G2 c/s 02/01/2018, desires VBAC   Support Person Rodricus fiance             Growth 02[birth year]1984  and NST next visit  Gestational age appropriate obstetric precautions including but not limited to vaginal bleeding, contractions, leaking of fluid and fetal movement were reviewed in detail with the patient.    Return in about 2 weeks (around 07/05/2019) for ROB in person and growth US/ NST.  Homero Fellers MD [facility], [facility] 06/21/2019, 12:37 PM

## 2019-06-21 NOTE — Patient Instructions (Signed)
Third Trimester of Pregnancy The third trimester is from week 28 through week 40 (months 7 through 9). The third trimester is a time when the unborn baby (fetus) is growing rapidly. At the end of the ninth month, the fetus is about 20 inches in length and weighs 6-10 pounds. Body changes during your third trimester Your body will continue to go through many changes during pregnancy. The changes vary from woman to woman. During the third trimester:  Your weight will continue to increase. You can expect to gain 25-35 pounds (11-16 kg) by the end of the pregnancy.  You may begin to get stretch marks on your hips, abdomen, and breasts.  You may urinate more often because the fetus is moving lower into your pelvis and pressing on your bladder.  You may develop or continue to have heartburn. This is caused by increased hormones that slow down muscles in the digestive tract.  You may develop or continue to have constipation because increased hormones slow digestion and cause the muscles that push waste through your intestines to relax.  You may develop hemorrhoids. These are swollen veins (varicose veins) in the rectum that can itch or be painful.  You may develop swollen, bulging veins (varicose veins) in your legs.  You may have increased body aches in the pelvis, back, or thighs. This is due to weight gain and increased hormones that are relaxing your joints.  You may have changes in your hair. These can include thickening of your hair, rapid growth, and changes in texture. Some women also have hair loss during or after pregnancy, or hair that feels dry or thin. Your hair will most likely return to normal after your baby is born.  Your breasts will continue to grow and they will continue to become tender. A yellow fluid (colostrum) may leak from your breasts. This is the first milk you are producing for your baby.  Your belly button may stick out.  You may notice more swelling in your hands,  face, or ankles.  You may have increased tingling or numbness in your hands, arms, and legs. The skin on your belly may also feel numb.  You may feel short of breath because of your expanding uterus.  You may have more problems sleeping. This can be caused by the size of your belly, increased need to urinate, and an increase in your body's metabolism.  You may notice the fetus "dropping," or moving lower in your abdomen (lightening).  You may have increased vaginal discharge.  You may notice your joints feel loose and you may have pain around your pelvic bone. What to expect at prenatal visits You will have prenatal exams every 2 weeks until week 36. Then you will have weekly prenatal exams. During a routine prenatal visit:  You will be weighed to make sure you and the baby are growing normally.  Your blood pressure will be taken.  Your abdomen will be measured to track your baby's growth.  The fetal heartbeat will be listened to.  Any test results from the previous visit will be discussed.  You may have a cervical check near your due date to see if your cervix has softened or thinned (effaced).  You will be tested for Group B streptococcus. This happens between 35 and 37 weeks. Your health care provider may ask you:  What your birth plan is.  How you are feeling.  If you are feeling the baby move.  If you have had any abnormal   symptoms, such as leaking fluid, bleeding, severe headaches, or abdominal cramping.  If you are using any tobacco products, including cigarettes, chewing tobacco, and electronic cigarettes.  If you have any questions. Other tests or screenings that may be performed during your third trimester include:  Blood tests that check for low iron levels (anemia).  Fetal testing to check the health, activity level, and growth of the fetus. Testing is done if you have certain medical conditions or if there are problems during the pregnancy.  Nonstress test  (NST). This test checks the health of your baby to make sure there are no signs of problems, such as the baby not getting enough oxygen. During this test, a belt is placed around your belly. The baby is made to move, and its heart rate is monitored during movement. What is false labor? False labor is a condition in which you feel small, irregular tightenings of the muscles in the womb (contractions) that usually go away with rest, changing position, or drinking water. These are called Braxton Hicks contractions. Contractions may last for hours, days, or even weeks before true labor sets in. If contractions come at regular intervals, become more frequent, increase in intensity, or become painful, you should see your health care provider. What are the signs of labor?  Abdominal cramps.  Regular contractions that start at 10 minutes apart and become stronger and more frequent with time.  Contractions that start on the top of the uterus and spread down to the lower abdomen and back.  Increased pelvic pressure and dull back pain.  A watery or bloody mucus discharge that comes from the vagina.  Leaking of amniotic fluid. This is also known as your "water breaking." It could be a slow trickle or a gush. Let your health care provider know if it has a color or strange odor. If you have any of these signs, call your health care provider right away, even if it is before your due date. Follow these instructions at home: Medicines  Follow your health care provider's instructions regarding medicine use. Specific medicines may be either safe or unsafe to take during pregnancy.  Take a prenatal vitamin that contains at least 600 micrograms (mcg) of folic acid.  If you develop constipation, try taking a stool softener if your health care provider approves. Eating and drinking   Eat a balanced diet that includes fresh fruits and vegetables, whole grains, good sources of protein such as meat, eggs, or tofu,  and low-fat dairy. Your health care provider will help you determine the amount of weight gain that is right for you.  Avoid raw meat and uncooked cheese. These carry germs that can cause birth defects in the baby.  If you have low calcium intake from food, talk to your health care provider about whether you should take a daily calcium supplement.  Eat four or five small meals rather than three large meals a day.  Limit foods that are high in fat and processed sugars, such as fried and sweet foods.  To prevent constipation: ? Drink enough fluid to keep your urine clear or pale yellow. ? Eat foods that are high in fiber, such as fresh fruits and vegetables, whole grains, and beans. Activity  Exercise only as directed by your health care provider. Most women can continue their usual exercise routine during pregnancy. Try to exercise for 30 minutes at least 5 days a week. Stop exercising if you experience uterine contractions.  Avoid heavy lifting.  Do   not exercise in extreme heat or humidity, or at high altitudes.  Wear low-heel, comfortable shoes.  Practice good posture.  You may continue to have sex unless your health care provider tells you otherwise. Relieving pain and discomfort  Take frequent breaks and rest with your legs elevated if you have leg cramps or low back pain.  Take warm sitz baths to soothe any pain or discomfort caused by hemorrhoids. Use hemorrhoid cream if your health care provider approves.  Wear a good support bra to prevent discomfort from breast tenderness.  If you develop varicose veins: ? Wear support pantyhose or compression stockings as told by your healthcare provider. ? Elevate your feet for 15 minutes, 3-4 times a day. Prenatal care  Write down your questions. Take them to your prenatal visits.  Keep all your prenatal visits as told by your health care provider. This is important. Safety  Wear your seat belt at all times when driving.  Make  a list of emergency phone numbers, including numbers for family, friends, the hospital, and police and fire departments. General instructions  Avoid cat litter boxes and soil used by cats. These carry germs that can cause birth defects in the baby. If you have a cat, ask someone to clean the litter box for you.  Do not travel far distances unless it is absolutely necessary and only with the approval of your health care provider.  Do not use hot tubs, steam rooms, or saunas.  Do not drink alcohol.  Do not use any products that contain nicotine or tobacco, such as cigarettes and e-cigarettes. If you need help quitting, ask your health care provider.  Do not use any medicinal herbs or unprescribed drugs. These chemicals affect the formation and growth of the baby.  Do not douche or use tampons or scented sanitary pads.  Do not cross your legs for long periods of time.  To prepare for the arrival of your baby: ? Take prenatal classes to understand, practice, and ask questions about labor and delivery. ? Make a trial run to the hospital. ? Visit the hospital and tour the maternity area. ? Arrange for maternity or paternity leave through employers. ? Arrange for family and friends to take care of pets while you are in the hospital. ? Purchase a rear-facing car seat and make sure you know how to install it in your car. ? Pack your hospital bag. ? Prepare the baby's nursery. Make sure to remove all pillows and stuffed animals from the baby's crib to prevent suffocation.  Visit your dentist if you have not gone during your pregnancy. Use a soft toothbrush to brush your teeth and be gentle when you floss. Contact a health care provider if:  You are unsure if you are in labor or if your water has broken.  You become dizzy.  You have mild pelvic cramps, pelvic pressure, or nagging pain in your abdominal area.  You have lower back pain.  You have persistent nausea, vomiting, or  diarrhea.  You have an unusual or bad smelling vaginal discharge.  You have pain when you urinate. Get help right away if:  Your water breaks before 37 weeks.  You have regular contractions less than 5 minutes apart before 37 weeks.  You have a fever.  You are leaking fluid from your vagina.  You have spotting or bleeding from your vagina.  You have severe abdominal pain or cramping.  You have rapid weight loss or weight gain.  You have   shortness of breath with chest pain.  You notice sudden or extreme swelling of your face, hands, ankles, feet, or legs.  Your baby makes fewer than 10 movements in 2 hours.  You have severe headaches that do not go away when you take medicine.  You have vision changes. Summary  The third trimester is from week 28 through week 40, months 7 through 9. The third trimester is a time when the unborn baby (fetus) is growing rapidly.  During the third trimester, your discomfort may increase as you and your baby continue to gain weight. You may have abdominal, leg, and back pain, sleeping problems, and an increased need to urinate.  During the third trimester your breasts will keep growing and they will continue to become tender. A yellow fluid (colostrum) may leak from your breasts. This is the first milk you are producing for your baby.  False labor is a condition in which you feel small, irregular tightenings of the muscles in the womb (contractions) that eventually go away. These are called Braxton Hicks contractions. Contractions may last for hours, days, or even weeks before true labor sets in.  Signs of labor can include: abdominal cramps; regular contractions that start at 10 minutes apart and become stronger and more frequent with time; watery or bloody mucus discharge that comes from the vagina; increased pelvic pressure and dull back pain; and leaking of amniotic fluid. This information is not intended to replace advice given to you by your  health care provider. Make sure you discuss any questions you have with your health care provider. Document Revised: 04/26/2018 Document Reviewed: 02/09/2016 Elsevier Patient Education  2020 [employer]  

## 2019-07-03 ENCOUNTER — Encounter: Payer: Self-pay | Admitting: Obstetrics and Gynecology

## 2019-07-03 ENCOUNTER — Other Ambulatory Visit (HOSPITAL_COMMUNITY)
Admission: RE | Admit: 2019-07-03 | Discharge: 2019-07-03 | Disposition: A | Discharge status: Home / Self Care | Payer: Medicaid Other | Source: Ambulatory Visit | Attending: Obstetrics and Gynecology | Admitting: Obstetrics and Gynecology

## 2019-07-03 ENCOUNTER — Other Ambulatory Visit: Payer: Self-pay

## 2019-07-03 ENCOUNTER — Ambulatory Visit (INDEPENDENT_AMBULATORY_CARE_PROVIDER_SITE_OTHER): Payer: Medicaid Other

## 2019-07-03 ENCOUNTER — Other Ambulatory Visit: Payer: Self-pay | Admitting: Obstetrics and Gynecology

## 2019-07-03 ENCOUNTER — Ambulatory Visit (INDEPENDENT_AMBULATORY_CARE_PROVIDER_SITE_OTHER): Payer: Medicaid Other | Admitting: Obstetrics and Gynecology

## 2019-07-03 VITALS — BP 118/72 | Ht 67.0 in | Wt 301.2 lb

## 2019-07-03 DIAGNOSIS — O365930 Maternal care for other known or suspected poor fetal growth, third trimester, not applicable or unspecified: Secondary | ICD-10-CM | POA: Diagnosis not present

## 2019-07-03 DIAGNOSIS — O0990 Supervision of high risk pregnancy, unspecified, unspecified trimester: Secondary | ICD-10-CM

## 2019-07-03 DIAGNOSIS — O09299 Supervision of pregnancy with other poor reproductive or obstetric history, unspecified trimester: Secondary | ICD-10-CM

## 2019-07-03 DIAGNOSIS — Z98891 History of uterine scar from previous surgery: Secondary | ICD-10-CM

## 2019-07-03 DIAGNOSIS — O99210 Obesity complicating pregnancy, unspecified trimester: Secondary | ICD-10-CM | POA: Diagnosis not present

## 2019-07-03 DIAGNOSIS — O0993 Supervision of high risk pregnancy, unspecified, third trimester: Secondary | ICD-10-CM

## 2019-07-03 DIAGNOSIS — Z3A35 35 weeks gestation of pregnancy: Secondary | ICD-10-CM

## 2019-07-03 DIAGNOSIS — O99213 Obesity complicating pregnancy, third trimester: Secondary | ICD-10-CM

## 2019-07-03 DIAGNOSIS — O09293 Supervision of pregnancy with other poor reproductive or obstetric history, third trimester: Secondary | ICD-10-CM

## 2019-07-03 LAB — POCT URINALYSIS DIPSTICK OB: Glucose, UA: NEGATIVE

## 2019-07-03 LAB — OB RESULTS CONSOLE GC/CHLAMYDIA: Gonorrhea: NEGATIVE

## 2019-07-03 NOTE — Patient Instructions (Addendum)
Third Trimester of Pregnancy The third trimester is from week 28 through week 40 (months 7 through 9). The third trimester is a time when the unborn baby (fetus) is growing rapidly. At the end of the ninth month, the fetus is about 20 inches in length and weighs 6-10 pounds. Body changes during your third trimester Your body will continue to go through many changes during pregnancy. The changes vary from woman to woman. During the third trimester:  Your weight will continue to increase. You can expect to gain 25-35 pounds (11-16 kg) by the end of the pregnancy.  You may begin to get stretch marks on your hips, abdomen, and breasts.  You may urinate more often because the fetus is moving lower into your pelvis and pressing on your bladder.  You may develop or continue to have heartburn. This is caused by increased hormones that slow down muscles in the digestive tract.  You may develop or continue to have constipation because increased hormones slow digestion and cause the muscles that push waste through your intestines to relax.  You may develop hemorrhoids. These are swollen veins (varicose veins) in the rectum that can itch or be painful.  You may develop swollen, bulging veins (varicose veins) in your legs.  You may have increased body aches in the pelvis, back, or thighs. This is due to weight gain and increased hormones that are relaxing your joints.  You may have changes in your hair. These can include thickening of your hair, rapid growth, and changes in texture. Some women also have hair loss during or after pregnancy, or hair that feels dry or thin. Your hair will most likely return to normal after your baby is born.  Your breasts will continue to grow and they will continue to become tender. A yellow fluid (colostrum) may leak from your breasts. This is the first milk you are producing for your baby.  Your belly button may stick out.  You may notice more swelling in your hands,  face, or ankles.  You may have increased tingling or numbness in your hands, arms, and legs. The skin on your belly may also feel numb.  You may feel short of breath because of your expanding uterus.  You may have more problems sleeping. This can be caused by the size of your belly, increased need to urinate, and an increase in your body's metabolism.  You may notice the fetus "dropping," or moving lower in your abdomen (lightening).  You may have increased vaginal discharge.  You may notice your joints feel loose and you may have pain around your pelvic bone. What to expect at prenatal visits You will have prenatal exams every 2 weeks until week 36. Then you will have weekly prenatal exams. During a routine prenatal visit:  You will be weighed to make sure you and the baby are growing normally.  Your blood pressure will be taken.  Your abdomen will be measured to track your baby's growth.  The fetal heartbeat will be listened to.  Any test results from the previous visit will be discussed.  You may have a cervical check near your due date to see if your cervix has softened or thinned (effaced).  You will be tested for Group B streptococcus. This happens between 35 and 37 weeks. Your health care provider may ask you:  What your birth plan is.  How you are feeling.  If you are feeling the baby move.  If you have had any abnormal   symptoms, such as leaking fluid, bleeding, severe headaches, or abdominal cramping.  If you are using any tobacco products, including cigarettes, chewing tobacco, and electronic cigarettes.  If you have any questions. Other tests or screenings that may be performed during your third trimester include:  Blood tests that check for low iron levels (anemia).  Fetal testing to check the health, activity level, and growth of the fetus. Testing is done if you have certain medical conditions or if there are problems during the pregnancy.  Nonstress test  (NST). This test checks the health of your baby to make sure there are no signs of problems, such as the baby not getting enough oxygen. During this test, a belt is placed around your belly. The baby is made to move, and its heart rate is monitored during movement. What is false labor? False labor is a condition in which you feel small, irregular tightenings of the muscles in the womb (contractions) that usually go away with rest, changing position, or drinking water. These are called Braxton Hicks contractions. Contractions may last for hours, days, or even weeks before true labor sets in. If contractions come at regular intervals, become more frequent, increase in intensity, or become painful, you should see your health care provider. What are the signs of labor?  Abdominal cramps.  Regular contractions that start at 10 minutes apart and become stronger and more frequent with time.  Contractions that start on the top of the uterus and spread down to the lower abdomen and back.  Increased pelvic pressure and dull back pain.  A watery or bloody mucus discharge that comes from the vagina.  Leaking of amniotic fluid. This is also known as your "water breaking." It could be a slow trickle or a gush. Let your health care provider know if it has a color or strange odor. If you have any of these signs, call your health care provider right away, even if it is before your due date. Follow these instructions at home: Medicines  Follow your health care provider's instructions regarding medicine use. Specific medicines may be either safe or unsafe to take during pregnancy.  Take a prenatal vitamin that contains at least 600 micrograms (mcg) of folic acid.  If you develop constipation, try taking a stool softener if your health care provider approves. Eating and drinking   Eat a balanced diet that includes fresh fruits and vegetables, whole grains, good sources of protein such as meat, eggs, or tofu,  and low-fat dairy. Your health care provider will help you determine the amount of weight gain that is right for you.  Avoid raw meat and uncooked cheese. These carry germs that can cause birth defects in the baby.  If you have low calcium intake from food, talk to your health care provider about whether you should take a daily calcium supplement.  Eat four or five small meals rather than three large meals a day.  Limit foods that are high in fat and processed sugars, such as fried and sweet foods.  To prevent constipation: ? Drink enough fluid to keep your urine clear or pale yellow. ? Eat foods that are high in fiber, such as fresh fruits and vegetables, whole grains, and beans. Activity  Exercise only as directed by your health care provider. Most women can continue their usual exercise routine during pregnancy. Try to exercise for 30 minutes at least 5 days a week. Stop exercising if you experience uterine contractions.  Avoid heavy lifting.  Do   not exercise in extreme heat or humidity, or at high altitudes.  Wear low-heel, comfortable shoes.  Practice good posture.  You may continue to have sex unless your health care provider tells you otherwise. Relieving pain and discomfort  Take frequent breaks and rest with your legs elevated if you have leg cramps or low back pain.  Take warm sitz baths to soothe any pain or discomfort caused by hemorrhoids. Use hemorrhoid cream if your health care provider approves.  Wear a good support bra to prevent discomfort from breast tenderness.  If you develop varicose veins: ? Wear support pantyhose or compression stockings as told by your healthcare provider. ? Elevate your feet for 15 minutes, 3-4 times a day. Prenatal care  Write down your questions. Take them to your prenatal visits.  Keep all your prenatal visits as told by your health care provider. This is important. Safety  Wear your seat belt at all times when driving.  Make  a list of emergency phone numbers, including numbers for family, friends, the hospital, and police and fire departments. General instructions  Avoid cat litter boxes and soil used by cats. These carry germs that can cause birth defects in the baby. If you have a cat, ask someone to clean the litter box for you.  Do not travel far distances unless it is absolutely necessary and only with the approval of your health care provider.  Do not use hot tubs, steam rooms, or saunas.  Do not drink alcohol.  Do not use any products that contain nicotine or tobacco, such as cigarettes and e-cigarettes. If you need help quitting, ask your health care provider.  Do not use any medicinal herbs or unprescribed drugs. These chemicals affect the formation and growth of the baby.  Do not douche or use tampons or scented sanitary pads.  Do not cross your legs for long periods of time.  To prepare for the arrival of your baby: ? Take prenatal classes to understand, practice, and ask questions about labor and delivery. ? Make a trial run to the hospital. ? Visit the hospital and tour the maternity area. ? Arrange for maternity or paternity leave through employers. ? Arrange for family and friends to take care of pets while you are in the hospital. ? Purchase a rear-facing car seat and make sure you know how to install it in your car. ? Pack your hospital bag. ? Prepare the baby's nursery. Make sure to remove all pillows and stuffed animals from the baby's crib to prevent suffocation.  Visit your dentist if you have not gone during your pregnancy. Use a soft toothbrush to brush your teeth and be gentle when you floss. Contact a health care provider if:  You are unsure if you are in labor or if your water has broken.  You become dizzy.  You have mild pelvic cramps, pelvic pressure, or nagging pain in your abdominal area.  You have lower back pain.  You have persistent nausea, vomiting, or  diarrhea.  You have an unusual or bad smelling vaginal discharge.  You have pain when you urinate. Get help right away if:  Your water breaks before 37 weeks.  You have regular contractions less than 5 minutes apart before 37 weeks.  You have a fever.  You are leaking fluid from your vagina.  You have spotting or bleeding from your vagina.  You have severe abdominal pain or cramping.  You have rapid weight loss or weight gain.  You have   shortness of breath with chest pain.  You notice sudden or extreme swelling of your face, hands, ankles, feet, or legs.  Your baby makes fewer than 10 movements in 2 hours.  You have severe headaches that do not go away when you take medicine.  You have vision changes. Summary  The third trimester is from week 28 through week 40, months 7 through 9. The third trimester is a time when the unborn baby (fetus) is growing rapidly.  During the third trimester, your discomfort may increase as you and your baby continue to gain weight. You may have abdominal, leg, and back pain, sleeping problems, and an increased need to urinate.  During the third trimester your breasts will keep growing and they will continue to become tender. A yellow fluid (colostrum) may leak from your breasts. This is the first milk you are producing for your baby.  False labor is a condition in which you feel small, irregular tightenings of the muscles in the womb (contractions) that eventually go away. These are called Braxton Hicks contractions. Contractions may last for hours, days, or even weeks before true labor sets in.  Signs of labor can include: abdominal cramps; regular contractions that start at 10 minutes apart and become stronger and more frequent with time; watery or bloody mucus discharge that comes from the vagina; increased pelvic pressure and dull back pain; and leaking of amniotic fluid. This information is not intended to replace advice given to you by your  health care provider. Make sure you discuss any questions you have with your health care provider. Document Revised: 04/26/2018 Document Reviewed: 02/09/2016 Elsevier Patient Education  2020 [employer]  

## 2019-07-03 NOTE — Progress Notes (Signed)
Routine Prenatal Care Visit  Subjective  Eileen Parsons is a 29 y.o. [id number] at [redacted]w[redacted]d being seen today for ongoing prenatal care.  She is currently monitored for the following issues for this high-risk pregnancy and has Supervision of high risk pregnancy, antepartum; History of shoulder dystocia in prior pregnancy, currently pregnant; Obesity affecting pregnancy, antepartum; BMI 40.0-44.9, adult (HCC); and History of cesarean delivery on their problem list.  ----------------------------------------------------------------------------------- Patient reports no complaints.   Contractions: Not present. Vag. Bleeding: None.  Movement: Present. Denies leaking of fluid.  ----------------------------------------------------------------------------------- The following portions of the patient's history were reviewed and updated as appropriate: allergies, current medications, past family history, past medical history, past social history, past surgical history and problem list. Problem list updated.   Objective  Blood pressure 118/72, height 5\' 7"  (1.702 m), weight (!) 301 lb 3.2 oz (136.6 kg), last menstrual period 10/25/2018, not currently breastfeeding. Pregravid weight 275 lb (124.7 kg) Total Weight Gain 26 lb 3.2 oz (11.9 kg) Urinalysis:      Fetal Status:     Movement: Present     General:  Alert, oriented and cooperative. Patient is in no acute distress.  Skin: Skin is warm and dry. No rash noted.   Cardiovascular: Normal heart rate noted  Respiratory: Normal respiratory effort, no problems with respiration noted  Abdomen: Soft, gravid, appropriate for gestational age. Pain/Pressure: Absent     Pelvic:  Cervical exam performed        Extremities: Normal range of motion.     Mental Status: Normal mood and affect. Normal behavior. Normal judgment and thought content.   NST: 135 bpm baseline, moderate variability, 15x15 accelerations, no decelerations.  Assessment   29 y.o. 26 at  [redacted]w[redacted]d by  08/01/2019, by Last Menstrual Period presenting for routine prenatal visit  Plan   Pregnancy#5 Problems (from 10/25/18 to present)    Problem Noted Resolved   Obesity affecting pregnancy, antepartum 01/17/2019 by 01/19/2019, CNM No   Overview Addendum 06/07/2019 11:40 AM by 06/09/2019, MD    Growth [X]  28 weeks 1100g or 2lbs 7oz c/w 47.1%ile [x ] 32 weeks  Growth percentile is 39.5%.  BPD 5%. HC 3.4% EFW: 1821 g  ( 4 lb 0 oz ) [ ]  36 weeks      Previous Version   Supervision of high risk pregnancy, antepartum 07/26/2017 by , CNM No   Overview Addendum 07/03/2019  8:45 AM by 09/26/2017, MD    [facility]  Dating LMP = 14 week [name] Blood type: O/Positive/-- (01/15 1054)   Genetic Screen  NIPS: normal XY Antibody:Negative (01/15 1054)  Anatomic [place] complete 03/15/2019 Female gender, suboptimal views of aortic arch Rubella: 1.65 (01/15 1054) Varicella: Immune  GTT Early: 99               28 wk: 115 RPR: Non Reactive (01/15 1054)   Rhogam n/a HBsAg: Negative (01/15 1054)   Vaccines TDAP:   06/07/2019         Flu Shot: declined HIV: Non Reactive (01/15 1054)   Baby Food Plans breast, bottle fed last two infants                              GBS:   Contraception  Pap:12/2018 normal  CBB  No Pelvis tested: 8lbs 11oz  CS/VBAC G2 c/s 02/01/2018, desires VBAC   Support Person Rodricus fiance  Previous Version       MFM referral, for inconsistent growth measurements Anesthesia referral Discussed VBAC risks with patient Discussed covid vaccination, patient desires to wait until postpartum  Gestational age appropriate obstetric precautions including but not limited to vaginal bleeding, contractions, leaking of fluid and fetal movement were reviewed in detail with the patient.    Return in about 1 week (around 07/10/2019) for WEEKLY ROB with NST for 4 weeks.  Homero Fellers MD [facility], [facility] 07/03/2019, 9:04 AM

## 2019-07-04 LAB — CERVICOVAGINAL ANCILLARY ONLY
Chlamydia: NEGATIVE
Comment: NEGATIVE
Comment: NORMAL
Neisseria Gonorrhea: NEGATIVE

## 2019-07-07 LAB — CULTURE, BETA STREP (GROUP B ONLY): Strep Gp B Culture: NEGATIVE

## 2019-07-08 ENCOUNTER — Ambulatory Visit: Payer: Medicaid Other

## 2019-07-08 ENCOUNTER — Other Ambulatory Visit: Payer: Self-pay | Admitting: Obstetrics and Gynecology

## 2019-07-08 ENCOUNTER — Telehealth: Payer: Self-pay

## 2019-07-08 DIAGNOSIS — O99213 Obesity complicating pregnancy, third trimester: Secondary | ICD-10-CM

## 2019-07-08 DIAGNOSIS — R638 Other symptoms and signs concerning food and fluid intake: Secondary | ICD-10-CM

## 2019-07-08 DIAGNOSIS — Z8759 Personal history of other complications of pregnancy, childbirth and the puerperium: Secondary | ICD-10-CM

## 2019-07-09 ENCOUNTER — Observation Stay
Admission: EM | Admit: 2019-07-09 | Discharge: 2019-07-10 | Disposition: A | Discharge status: Home / Self Care | Payer: Medicaid Other | Attending: Obstetrics and Gynecology | Admitting: Obstetrics and Gynecology

## 2019-07-09 ENCOUNTER — Encounter: Payer: Self-pay | Admitting: Obstetrics and Gynecology

## 2019-07-09 ENCOUNTER — Other Ambulatory Visit: Payer: Self-pay

## 2019-07-09 DIAGNOSIS — Z841 Family history of disorders of kidney and ureter: Secondary | ICD-10-CM | POA: Diagnosis not present

## 2019-07-09 DIAGNOSIS — Z3A36 36 weeks gestation of pregnancy: Secondary | ICD-10-CM | POA: Insufficient documentation

## 2019-07-09 DIAGNOSIS — Z8759 Personal history of other complications of pregnancy, childbirth and the puerperium: Secondary | ICD-10-CM | POA: Insufficient documentation

## 2019-07-09 DIAGNOSIS — O479 False labor, unspecified: Secondary | ICD-10-CM | POA: Diagnosis present

## 2019-07-09 DIAGNOSIS — Z833 Family history of diabetes mellitus: Secondary | ICD-10-CM | POA: Insufficient documentation

## 2019-07-09 DIAGNOSIS — O99210 Obesity complicating pregnancy, unspecified trimester: Secondary | ICD-10-CM

## 2019-07-09 DIAGNOSIS — O0990 Supervision of high risk pregnancy, unspecified, unspecified trimester: Secondary | ICD-10-CM

## 2019-07-09 DIAGNOSIS — O4700 False labor before 37 completed weeks of gestation, unspecified trimester: Secondary | ICD-10-CM | POA: Diagnosis present

## 2019-07-09 NOTE — OB Triage Note (Signed)
[facility] with [facility] of [facility]. Pt state that the contractions started today, about 2 hours ago. Pt states that she has been feeling her baby move. Pt denies vaginal bleeding or LOF. Pt mentions that she had diarrhea three times today.  Monitors applied, initial FHT 145.

## 2019-07-10 DIAGNOSIS — O4700 False labor before 37 completed weeks of gestation, unspecified trimester: Secondary | ICD-10-CM | POA: Diagnosis present

## 2019-07-10 MED ORDER — ACETAMINOPHEN 500 MG PO TABS
ORAL_TABLET | ORAL | Status: AC
  Administered 2019-07-10: 1000 mg via ORAL
  Filled 2019-07-10: qty 2

## 2019-07-10 MED ORDER — ZOLPIDEM TARTRATE 5 MG PO TABS: 5.0000 mg | ORAL_TABLET | Freq: Once | ORAL | Status: AC

## 2019-07-10 MED ORDER — ACETAMINOPHEN 500 MG PO TABS: 1000.0000 mg | ORAL_TABLET | Freq: Once | ORAL | Status: AC

## 2019-07-10 MED ORDER — ZOLPIDEM TARTRATE 5 MG PO TABS
ORAL_TABLET | ORAL | Status: AC
  Administered 2019-07-10: 5 mg via ORAL
  Filled 2019-07-10: qty 1

## 2019-07-10 NOTE — Discharge Instructions (Signed)
LABOR: When contractions begin, you should start to time them from the beginning of one contraction to the beginning of the next.  When contractions are 5-10 minutes apart or less and have been regular for at least an hour, you should call your health care provider.  Notify your doctor if any of the following occur: 1. Bleeding from the vagina 7. Sudden, constant, or occasional abdominal pain  2. Pain or burning when urinating 8. Sudden gushing of fluid from the vagina (with or without continued leaking)  3. Chills or fever 9. Fainting spells, "black outs" or loss of consciousness  4. Increase in vaginal discharge 10. Severe or continued nausea or vomiting  5. Pelvic pressure (sudden increase) 11. Blurring of vision or spots before the eyes  6. Baby moving less than usual 12. Leaking of fluid    FETAL KICK COUNT: Lie on your left side for one hour after a meal, and count the number of times your baby kicks. If it is less than 5 times, get up, move around and drink some juice. Repeat the test 30 minutes later. If it is still less than 5 kicks in an hour, notify your doctor.Get some rest and stay well hydrated!   Come back if you have any vaginal bleeding, contractions 3-5 min for at least 1-2 hours, if you can not feel your baby move and if you feel a big gush of fluid.

## 2019-07-10 NOTE — Discharge Summary (Signed)
Physician Discharge Summary  Patient ID: Eileen Parsons MRN: [id number] DOB/AGE: 28-26-1992 28 y.o.  Admit date: 07/09/2019 Discharge date: 07/10/2019  Admission Diagnoses:  Discharge Diagnoses:  Active Problems:   Preterm contractions   Discharged Condition: good  Hospital Course: See note  Consults: None  Significant Diagnostic Studies:none  Treatments: hydration  Discharge Exam: Blood pressure 114/68, pulse (!) 108, temperature 98.3 F (36.8 C), temperature source Oral, resp. rate 18, height 5\' 6"  (1.676 m), weight 136.1 kg, last menstrual period 10/25/2018, not currently breastfeeding. General appearance: alert, cooperative and appears stated age Resp: clear to auscultation bilaterally Chest wall: no tenderness Cardio: regular rate and rhythm, S1, S2 normal, no murmur, click, rub or gallop GI: soft, non-tender; bowel sounds normal; no masses,  no organomegaly Skin: Skin color, texture, turgor normal. No rashes or lesions  Disposition: Discharge disposition: 01-Home or Self Care        Allergies as of 07/10/2019   No Known Allergies     Medication List    TAKE these medications   prenatal multivitamin Tabs tablet Take 1 tablet by mouth daily at 12 noon.        Signed: 07/12/2019 07/10/2019, 5:10 AM

## 2019-07-10 NOTE — OB Triage Note (Signed)
Pt discharged home in stable condition. RN provided discharge instructions to patient. RN provided ordered Ambien that is to be given at home. RN provided instructions on taking Ambien.  All questions answered at this time.

## 2019-07-10 NOTE — H&P (Signed)
Eileen Parsons is an 29 y.o. female.   Chief Complaint: Uterine contractions HPI: She presents today with complaints of uterine contractions.  She reports that the contractions began roughly 2 hours ago.  She reports that she was doing laundry and moving things around getting ready for the baby.  She denies any leakage of fluid or vaginal bleeding.  She is feeling normal fetal movements.  This pregnancy has been complicated by history of C-section.  She desires a repeat vaginal delivery.  She has a history of shoulder dystocia with her previous pregnancy.  Pregnancy has been complicated by obesity.  She has follow-up plan for an growth ultrasound tomorrow.   Pregnancy#5 Problems (from 10/25/18 to present)    Problem Noted Resolved   Obesity affecting pregnancy, antepartum 01/17/2019 by Rod Can, CNM No   Overview Addendum 07/03/2019  9:07 AM by Homero Fellers, MD    Growth [X]  28 weeks 1100g or 2lbs 7oz c/w 47.1%ile [x ] 32 weeks  Growth percentile is 39.5%.  BPD 5%. HC 3.4% EFW: 1821 g  ( 4 lb 0 oz ) [x ] 36 weeks  Growth percentile is 22.1%.  AC  13.3%, BPD: 3.4%. HC: <2.3% EFW: 2440 g ( 5 lb 6 oz )       Previous Version   Supervision of high risk pregnancy, antepartum 07/26/2017 by Dalia Heading, CNM No   Overview Addendum 07/03/2019  9:29 AM by Homero Fellers, MD    Clinic [facility] Prenatal Labs  Dating LMP = 14 week [place] Blood type: O/Positive/-- (01/15 1054)   Genetic Screen  NIPS: normal XY Antibody:Negative (01/15 1054)  Anatomic [place] complete 03/15/2019 Female gender, suboptimal views of aortic arch Rubella: 1.65 (01/15 1054) Varicella: Immune  GTT Early: 99               28 wk: 115 RPR: Non Reactive (01/15 1054)   Rhogam n/a HBsAg: Negative (01/15 1054)   Vaccines TDAP:   06/07/2019         Flu Shot: declined HIV: Non Reactive (01/15 1054)   Baby Food Plans breast, bottle fed last two infants                              GBS: negative  Contraception  paragard  Pap:12/2018 normal  CBB  No Pelvis tested: 8lbs 11oz  CS/VBAC G2 c/s 02/01/2018, desires VBAC   Support Person Rodricus fiance          Previous Version      Past Medical History:  Diagnosis Date  . History of shoulder dystocia in prior pregnancy    with first pregnancy  . Medical history non-contributory   . Obesity    BMI=40.25 kb/m2    Past Surgical History:  Procedure Laterality Date  . CESAREAN SECTION N/A 02/01/2018   Procedure: CESAREAN SECTION;  Surgeon: Homero Fellers, MD;  Location: [facility] ORS;  Service: Obstetrics;  Laterality: N/A;  . MOUTH SURGERY      Family History  Problem Relation Age of Onset  . Alcohol abuse Mother   . Diabetes Mellitus II Paternal Aunt   . Diabetes Mellitus II Paternal Uncle   . Lupus Maternal Grandmother   . Kidney failure Paternal Grandfather   . Breast cancer Neg Hx   . Ovarian cancer Neg Hx   . Colon cancer Neg Hx    Social History:  reports that she has never smoked. She has never  used smokeless tobacco. She reports previous alcohol use. She reports that she does not use drugs.  Allergies: No Known Allergies  Medications Prior to Admission  Medication Sig Dispense Refill  . Prenatal Vit-Fe Fumarate-FA (PRENATAL MULTIVITAMIN) TABS tablet Take 1 tablet by mouth daily at 12 noon.      No results found for this or any previous visit (from the past 48 hour(s)). No results found.  Review of Systems  Constitutional: Negative for chills and fever.  HENT: Negative for congestion, hearing loss and sinus pain.   Respiratory: Negative for cough, shortness of breath and wheezing.   Cardiovascular: Negative for chest pain, palpitations and leg swelling.  Gastrointestinal: Negative for abdominal pain, constipation, diarrhea, nausea and vomiting.  Genitourinary: Negative for dysuria, flank pain, frequency, hematuria and urgency.  Musculoskeletal: Negative for back pain.  Skin: Negative for rash.  Neurological: Negative for  dizziness and headaches.  Psychiatric/Behavioral: Negative for suicidal ideas. The patient is not nervous/anxious.     Blood pressure 136/71, pulse 97, temperature 98.7 F (37.1 C), temperature source Oral, resp. rate 18, height 5\' 6"  (1.676 m), weight 136.1 kg, last menstrual period 10/25/2018, not currently breastfeeding. Physical Exam  Nursing note and vitals reviewed. Constitutional: She is oriented to person, place, and time. She appears well-developed.  HENT:  Head: Normocephalic and atraumatic.  Cardiovascular: Normal rate and regular rhythm.  Respiratory: Effort normal and breath sounds normal.  GI: Soft. Bowel sounds are normal.  Musculoskeletal:        General: Normal range of motion.  Neurological: She is alert and oriented to person, place, and time.  Skin: Skin is warm and dry.  Psychiatric: Her behavior is normal. Judgment and thought content normal.    NST: 135 bpm baseline, moderate variability, 15x15 accelerations, no decelerations. Tocometer : every 2-3 minutes  SVE: 1/20/-3  Assessment/Plan 29 year old G5 P2-0-2-2 at 74 weeks 6 days gestational age 35 Uterine contractions.  Patient has not made cervical change since her exam last week.  I encouraged oral fluid hydration.  Patient will be monitored for 1 or 2 hours and rechecked.  If no cervical change is made or if contractions resolve reassurance will be given.  UPDATE: Patient has continued to have some contractions, but she feels like they are manageable. She would like to be discharged home with an 31 for sleep. She has close follow up planned. Discussed reasons to return to the hospital.   [place], MD 07/10/2019, 12:10 AM

## 2019-07-11 ENCOUNTER — Ambulatory Visit (INDEPENDENT_AMBULATORY_CARE_PROVIDER_SITE_OTHER): Payer: Medicaid Other | Admitting: Obstetrics and Gynecology

## 2019-07-11 ENCOUNTER — Ambulatory Visit: Payer: Medicaid Other

## 2019-07-11 ENCOUNTER — Ambulatory Visit (HOSPITAL_BASED_OUTPATIENT_CLINIC_OR_DEPARTMENT_OTHER)
Admission: RE | Admit: 2019-07-11 | Discharge: 2019-07-11 | Disposition: A | Discharge status: Home / Self Care | Payer: Medicaid Other | Source: Ambulatory Visit | Attending: Obstetrics and Gynecology | Admitting: Obstetrics and Gynecology

## 2019-07-11 ENCOUNTER — Encounter: Payer: Self-pay | Admitting: Obstetrics and Gynecology

## 2019-07-11 ENCOUNTER — Other Ambulatory Visit: Payer: Self-pay

## 2019-07-11 ENCOUNTER — Ambulatory Visit
Admission: RE | Admit: 2019-07-11 | Discharge: 2019-07-11 | Disposition: A | Discharge status: Home / Self Care | Payer: Medicaid Other | Source: Ambulatory Visit | Attending: Obstetrics and Gynecology | Admitting: Obstetrics and Gynecology

## 2019-07-11 VITALS — BP 130/72 | Ht 63.0 in | Wt 304.6 lb

## 2019-07-11 DIAGNOSIS — O99213 Obesity complicating pregnancy, third trimester: Secondary | ICD-10-CM | POA: Diagnosis not present

## 2019-07-11 DIAGNOSIS — O09299 Supervision of pregnancy with other poor reproductive or obstetric history, unspecified trimester: Secondary | ICD-10-CM

## 2019-07-11 DIAGNOSIS — Z8759 Personal history of other complications of pregnancy, childbirth and the puerperium: Secondary | ICD-10-CM | POA: Diagnosis not present

## 2019-07-11 DIAGNOSIS — R638 Other symptoms and signs concerning food and fluid intake: Secondary | ICD-10-CM

## 2019-07-11 DIAGNOSIS — Z3A37 37 weeks gestation of pregnancy: Secondary | ICD-10-CM | POA: Diagnosis not present

## 2019-07-11 DIAGNOSIS — Z98891 History of uterine scar from previous surgery: Secondary | ICD-10-CM

## 2019-07-11 DIAGNOSIS — O09293 Supervision of pregnancy with other poor reproductive or obstetric history, third trimester: Secondary | ICD-10-CM | POA: Diagnosis not present

## 2019-07-11 DIAGNOSIS — O3663X0 Maternal care for excessive fetal growth, third trimester, not applicable or unspecified: Secondary | ICD-10-CM | POA: Insufficient documentation

## 2019-07-11 DIAGNOSIS — O480 Post-term pregnancy: Secondary | ICD-10-CM | POA: Insufficient documentation

## 2019-07-11 DIAGNOSIS — O99210 Obesity complicating pregnancy, unspecified trimester: Secondary | ICD-10-CM

## 2019-07-11 DIAGNOSIS — E669 Obesity, unspecified: Secondary | ICD-10-CM | POA: Diagnosis not present

## 2019-07-11 DIAGNOSIS — O34219 Maternal care for unspecified type scar from previous cesarean delivery: Secondary | ICD-10-CM | POA: Insufficient documentation

## 2019-07-11 DIAGNOSIS — O365930 Maternal care for other known or suspected poor fetal growth, third trimester, not applicable or unspecified: Secondary | ICD-10-CM | POA: Diagnosis not present

## 2019-07-11 DIAGNOSIS — O0990 Supervision of high risk pregnancy, unspecified, unspecified trimester: Secondary | ICD-10-CM

## 2019-07-11 DIAGNOSIS — O0993 Supervision of high risk pregnancy, unspecified, third trimester: Secondary | ICD-10-CM

## 2019-07-11 LAB — POCT URINALYSIS DIPSTICK OB: Glucose, UA: NEGATIVE

## 2019-07-11 LAB — FETAL NONSTRESS TEST

## 2019-07-11 NOTE — Progress Notes (Signed)
Routine Prenatal Care Visit  Subjective  Eileen Parsons is a 29 y.o. [id number] at [redacted]w[redacted]d being seen today for ongoing prenatal care.  She is currently monitored for the following issues for this high-risk pregnancy and has Supervision of high risk pregnancy, antepartum; History of shoulder dystocia in prior pregnancy, currently pregnant; Obesity affecting pregnancy, antepartum; BMI 40.0-44.9, adult (HCC); History of cesarean delivery; and Preterm contractions on their problem list.  ----------------------------------------------------------------------------------- Patient reports no complaints.   Contractions: Not present. Vag. Bleeding: None.  Movement: Present. Denies leaking of fluid.  ----------------------------------------------------------------------------------- The following portions of the patient's history were reviewed and updated as appropriate: allergies, current medications, past family history, past medical history, past social history, past surgical history and problem list. Problem list updated.   Objective  Blood pressure 130/72, height 5\' 3"  (1.6 m), weight (!) 304 lb 9.6 oz (138.2 kg), last menstrual period 10/25/2018, not currently breastfeeding. Pregravid weight 275 lb (124.7 kg) Total Weight Gain 29 lb 9.6 oz (13.4 kg) Urinalysis:      Fetal Status:     Movement: Present     General:  Alert, oriented and cooperative. Patient is in no acute distress.  Skin: Skin is warm and dry. No rash noted.   Cardiovascular: Normal heart rate noted  Respiratory: Normal respiratory effort, no problems with respiration noted  Abdomen: Soft, gravid, appropriate for gestational age. Pain/Pressure: Absent     Pelvic:  Cervical exam performed        Extremities: Normal range of motion.     Mental Status: Normal mood and affect. Normal behavior. Normal judgment and thought content.    NST: 135 bpm baseline, moderate variability, 15x15 accelerations, no decelerations.  Assessment    29 y.o. 26 at [redacted]w[redacted]d by  08/01/2019, by Last Menstrual Period presenting for routine prenatal visit  Plan   Pregnancy#5 Problems (from 10/25/18 to present)    Problem Noted Resolved   Obesity affecting pregnancy, antepartum 01/17/2019 by 01/19/2019, CNM No   Overview Addendum 07/03/2019  9:07 AM by 07/05/2019, MD    Growth [X]  28 weeks 1100g or 2lbs 7oz c/w 47.1%ile [x ] 32 weeks  Growth percentile is 39.5%.  BPD 5%. HC 3.4% EFW: 1821 g  ( 4 lb 0 oz ) [x ] 36 weeks  Growth percentile is 22.1%.  AC  13.3%, BPD: 3.4%. HC: <2.3% EFW: 2440 g ( 5 lb 6 oz )       Previous Version   Supervision of high risk pregnancy, antepartum 07/26/2017 by , CNM No   Overview Addendum 07/03/2019  9:29 AM by [name], MD    Clinic [facility] Prenatal Labs  Dating LMP = 14 week 07/05/2019 Blood type: O/Positive/-- (01/15 1054)   Genetic Screen  NIPS: normal XY Antibody:Negative (01/15 1054)  Anatomic 02-25-1983 complete 03/15/2019 Female gender, suboptimal views of aortic arch Rubella: 1.65 (01/15 1054) Varicella: Immune  GTT Early: 99               28 wk: 115 RPR: Non Reactive (01/15 1054)   Rhogam n/a HBsAg: Negative (01/15 1054)   Vaccines TDAP:   06/07/2019         Flu Shot: declined HIV: Non Reactive (01/15 1054)   Baby Food Plans breast, bottle fed last two infants                              GBS:   Contraception  paragard Pap:12/2018 normal  CBB  No Pelvis tested: 8lbs 11oz  CS/VBAC G2 c/s 02/01/2018, desires VBAC   Support Person Rodricus fiance          Previous Version       Gestational age appropriate obstetric precautions including but not limited to vaginal bleeding, contractions, leaking of fluid and fetal movement were reviewed in detail with the patient.    Return in about 1 week (around 07/18/2019) for ROB/ NST/ AFI.  Homero Fellers MD [facility] 07/11/2019, 4:05 PM

## 2019-07-11 NOTE — Consult Note (Signed)
Maternal-Fetal Medicine  Name: Eileen Parsons MRN: [id number] Referring Provider: Adelene Idler, MD  [name], G5 P2 at 37-weeks' gestation, is here for a second-opinion ultrasound. On your office scan, the head circumference measurement was at less than the 3rd percentile. Obstetric history is significant for a term vaginal delivery in 2014 of a female infant weighing 3,941 grams at birth. Her labor was complicated by shoulder dystocia. The infant did not sustain any neurological injuries and her son is in good health. In 2020, she had a term cesarean delivery (nonreassuring fetal heart trace) of a female infant weighing 3,150 grams. Patient does not have gestational diabetes and her prenatal course has been uneventful. On today's ultrasound, amniotic fluid is normal and good fetal activity is seen. Fetal growth is appropriate for gestational age. Head circumference measurement (using chart recommended by [facility]) is at between -2 and -1 SD (not consistent with microcephaly). Amniotic fluid is normal and good fetal activity is seen. Fetal anatomical survey appears normal but limited by advanced gestational age. Cephalic presentation. Placenta is anterior and there is no evidence of previa or accreta. I reassured the patient of the findings. Blood pressure today at our office is 110/79 mm Hg.  Our concerns include: History of shoulder dystocia I counseled her on the recurrence rate of shoulder dystocia, which is roughly between 10% to 15%. As more cesarean deliveries are performed in patients with history of shoulder dystocia, the recurrence rate is likely to be higher. Patient does not have gestational diabetes and there is no evidence of macrosomia on ultrasound. However, ultrasound has limitations in accurately-predicting fetal weights. To increase the likelihood of vaginal delivery, induction may be delayed to 40 weeks or beyond prov Patient expressed strong desire to have vaginal delivery and  informed that she will discuss with you.  Previous cesarean delivery and VBAC I counseled the patient that vaginal delivery is associated with scar rupture in about 1% of cases. We would expect a slightly higher risk of scan rupture (2% to 3%) if she undergoes induction of labor. Prostaglandins are to be avoided and oxytocin is safe. I reassured the patient of placental position (no previa or accrete). I also informed her that repeat cesarean deliveries increase the risk of placenta previa and/or accreta in subsequent pregnancies.  Recommendations: -VBAC counseling by her ob provider. -If patient chooses cesarean delivery, it may be performed at 39 weeks. -Reassured the patient of normal head circumference measurements.  Thank you for ultrasound and consultation. If you have any questions, please contact me at the [facility], [facility]. Consultation including face-to-face counseling 30 minutes.

## 2019-07-17 ENCOUNTER — Observation Stay
Admission: EM | Admit: 2019-07-17 | Discharge: 2019-07-17 | Disposition: A | Discharge status: Home / Self Care | Payer: Medicaid Other | Attending: Obstetrics and Gynecology | Admitting: Obstetrics and Gynecology

## 2019-07-17 ENCOUNTER — Ambulatory Visit (INDEPENDENT_AMBULATORY_CARE_PROVIDER_SITE_OTHER): Payer: Medicaid Other | Admitting: Obstetrics and Gynecology

## 2019-07-17 ENCOUNTER — Encounter: Payer: Self-pay | Admitting: Obstetrics and Gynecology

## 2019-07-17 ENCOUNTER — Other Ambulatory Visit: Payer: Self-pay

## 2019-07-17 ENCOUNTER — Ambulatory Visit (INDEPENDENT_AMBULATORY_CARE_PROVIDER_SITE_OTHER): Payer: Medicaid Other

## 2019-07-17 ENCOUNTER — Other Ambulatory Visit: Payer: Self-pay | Admitting: Obstetrics and Gynecology

## 2019-07-17 VITALS — BP 128/72 | Ht 63.0 in | Wt 303.6 lb

## 2019-07-17 DIAGNOSIS — Z3A37 37 weeks gestation of pregnancy: Secondary | ICD-10-CM | POA: Diagnosis not present

## 2019-07-17 DIAGNOSIS — O99210 Obesity complicating pregnancy, unspecified trimester: Secondary | ICD-10-CM

## 2019-07-17 DIAGNOSIS — Z98891 History of uterine scar from previous surgery: Secondary | ICD-10-CM | POA: Diagnosis not present

## 2019-07-17 DIAGNOSIS — O26893 Other specified pregnancy related conditions, third trimester: Principal | ICD-10-CM | POA: Insufficient documentation

## 2019-07-17 DIAGNOSIS — O09299 Supervision of pregnancy with other poor reproductive or obstetric history, unspecified trimester: Secondary | ICD-10-CM

## 2019-07-17 DIAGNOSIS — Z833 Family history of diabetes mellitus: Secondary | ICD-10-CM | POA: Diagnosis not present

## 2019-07-17 DIAGNOSIS — IMO0002 Reserved for concepts with insufficient information to code with codable children: Secondary | ICD-10-CM

## 2019-07-17 DIAGNOSIS — O0990 Supervision of high risk pregnancy, unspecified, unspecified trimester: Secondary | ICD-10-CM

## 2019-07-17 DIAGNOSIS — Z841 Family history of disorders of kidney and ureter: Secondary | ICD-10-CM | POA: Diagnosis not present

## 2019-07-17 DIAGNOSIS — O09293 Supervision of pregnancy with other poor reproductive or obstetric history, third trimester: Secondary | ICD-10-CM | POA: Diagnosis not present

## 2019-07-17 DIAGNOSIS — O288 Other abnormal findings on antenatal screening of mother: Secondary | ICD-10-CM | POA: Diagnosis not present

## 2019-07-17 DIAGNOSIS — Z0489 Encounter for examination and observation for other specified reasons: Secondary | ICD-10-CM

## 2019-07-17 DIAGNOSIS — O368330 Maternal care for abnormalities of the fetal heart rate or rhythm, third trimester, not applicable or unspecified: Secondary | ICD-10-CM | POA: Diagnosis not present

## 2019-07-17 DIAGNOSIS — O99213 Obesity complicating pregnancy, third trimester: Secondary | ICD-10-CM

## 2019-07-17 LAB — FETAL NONSTRESS TEST

## 2019-07-17 NOTE — Progress Notes (Signed)
Orders placed for elective induction of labor 07/29/19

## 2019-07-17 NOTE — OB Triage Note (Signed)
Patient presents to L&D from office visit for monitoring due to reactive NST in the office. Patient reports + fetal movement and denies contractions, leaking of fluid, and vaginal bleeding. Vital signs stable. Will continue to monitor.

## 2019-07-17 NOTE — Addendum Note (Signed)
Addended by: Tresea Mall on: 07/17/2019 07:37 PM   Modules accepted: Orders, SmartSet

## 2019-07-17 NOTE — Discharge Summary (Addendum)
Physician Final Progress Note  Patient ID: Eileen Parsons MRN: [id number] DOB/AGE: 1990/06/27 29 y.o.  Admit date: 07/17/2019 Admitting provider: Tresea Mall, CNM Discharge date: 07/17/2019   Admission Diagnoses: non-reactive stress test in office  Discharge Diagnoses:  Active Problems:   Non-reactive NST (non-stress test) IUP at 37 weeks Reassuring fetal well being  History of Present Illness: The patient is a 29 y.o. female [phone] at [redacted]w[redacted]d who presents from the office for fetal monitoring due to non-reactive stress test in the office this morning. She admits good fetal movement. She denies vaginal bleeding and leakage of fluid. She admits irregular contractions daily. She requests elective induction at earliest possible time preferably with Dr Jerene Pitch or Dr Bonney Aid.   Patient has follow up clinic appointment scheduled. We scheduled her for IOL on 7/12 at 8 AM. She will have covid swab on 7/9.   Past Medical History:  Diagnosis Date  . History of shoulder dystocia in prior pregnancy    with first pregnancy  . Medical history non-contributory   . Obesity    BMI=40.25 kb/m2    Past Surgical History:  Procedure Laterality Date  . CESAREAN SECTION N/A 02/01/2018   Procedure: CESAREAN SECTION;  Surgeon: Natale Milch, MD;  Location: [facility] ORS;  Service: Obstetrics;  Laterality: N/A;  . MOUTH SURGERY      No current facility-administered medications on file prior to encounter.   Current Outpatient Medications on File Prior to Encounter  Medication Sig Dispense Refill  . Prenatal Vit-Fe Fumarate-FA (PRENATAL MULTIVITAMIN) TABS tablet Take 1 tablet by mouth daily at 12 noon.      No Known Allergies  Social History   Socioeconomic History  . Marital status: Single    Spouse name: Rod  . Number of children: 1  . Years of education: Not on file  . Highest education level: Not on file  Occupational History  . Not on file  Tobacco Use  . Smoking status: Never  Smoker  . Smokeless tobacco: Never Used  Vaping Use  . Vaping Use: Never used  Substance and Sexual Activity  . Alcohol use: Not Currently    Comment: weekends-stopped alcohol with pregnancy  . Drug use: Never  . Sexual activity: Yes    Birth control/protection: None, I.U.D.    Comment: planning IUD after delivery   Other Topics Concern  . Not on file  Social History Narrative  . Not on file   Social Determinants of Health   Financial Resource Strain:   . Difficulty of Paying Living Expenses:   Food Insecurity:   . Worried About Programme researcher, broadcasting/film/video in the Last Year:   . Barista in the Last Year:   Transportation Needs:   . Freight forwarder (Medical):   Marland Kitchen Lack of Transportation (Non-Medical):   Physical Activity:   . Days of Exercise per Week:   . Minutes of Exercise per Session:   Stress:   . Feeling of Stress :   Social Connections:   . Frequency of Communication with Friends and Family:   . Frequency of Social Gatherings with Friends and Family:   . Attends Religious Services:   . Active Member of Clubs or Organizations:   . Attends Banker Meetings:   Marland Kitchen Marital Status:   Intimate Partner Violence:   . Fear of Current or Ex-Partner:   . Emotionally Abused:   Marland Kitchen Physically Abused:   . Sexually Abused:     Family History  Problem Relation Age of Onset  . Alcohol abuse Mother   . Diabetes Mellitus II Paternal Aunt   . Diabetes Mellitus II Paternal Uncle   . Lupus Maternal Grandmother   . Kidney failure Paternal Grandfather   . Breast cancer Neg Hx   . Ovarian cancer Neg Hx   . Colon cancer Neg Hx      Review of Systems  Constitutional: Negative for chills and fever.  HENT: Negative for congestion, ear discharge, ear pain, hearing loss, sinus pain and sore throat.   Eyes: Negative for blurred vision and double vision.  Respiratory: Negative for cough, shortness of breath and wheezing.   Cardiovascular: Negative for chest pain,  palpitations and leg swelling.  Gastrointestinal: Negative for abdominal pain, blood in stool, constipation, diarrhea, heartburn, melena, nausea and vomiting.  Genitourinary: Negative for dysuria, flank pain, frequency, hematuria and urgency.  Musculoskeletal: Negative for back pain, joint pain and myalgias.  Skin: Negative for itching and rash.  Neurological: Negative for dizziness, tingling, tremors, sensory change, speech change, focal weakness, seizures, loss of consciousness, weakness and headaches.  Endo/Heme/Allergies: Negative for environmental allergies. Does not bruise/bleed easily.  Psychiatric/Behavioral: Negative for depression, hallucinations, memory loss, substance abuse and suicidal ideas. The patient is not nervous/anxious and does not have insomnia.      Physical Exam: BP 129/85 (BP Location: Left Arm)   Pulse 99   Temp 99 F (37.2 C) (Oral)   Resp 16   Ht 5\' 6"  (1.676 m)   Wt (!) 137.4 kg   LMP 10/25/2018 (Exact Date)   BMI 48.91 kg/m   Constitutional: Well nourished, well developed female in no acute distress.  HEENT: normal Skin: Warm and dry.  Cardiovascular: Regular rate and rhythm.   Extremity: no edema  Respiratory: Clear to auscultation bilateral. Normal respiratory effort Abdomen: FHT present Back: no CVAT Neuro: DTRs 2+, Cranial nerves grossly intact Psych: Alert and Oriented x3. No memory deficits. Normal mood and affect.  MS: normal gait, normal bilateral lower extremity ROM/strength/stability.  Pelvic exam: (female chaperone present) is not limited by body habitus EGBUS: within normal limits Vagina: within normal limits and with normal mucosa  Cervix: anterior 1/50-60/-2 scant blood on glove after cervical sweep  Fetal Well Being: Reactive NST 135 bpm, moderate variability, +accelerations, -decelerations Toco: negative for contractions  Consults: None  Significant Findings/ Diagnostic Studies: none  Procedures: NST  Hospital Course: The  patient was admitted to Labor and Delivery Triage for observation.   Discharge Condition: good  Disposition: Discharge disposition: 01-Home or Self Care  Diet: Regular diet  Discharge Activity: Activity as tolerated  Discharge Instructions    Discharge activity:  No Restrictions   Complete by: As directed    Discharge diet:  No restrictions   Complete by: As directed    Fetal Kick Count:  Lie on our left side for one hour after a meal, and count the number of times your baby kicks.  If it is less than 5 times, get up, move around and drink some juice.  Repeat the test 30 minutes later.  If it is still less than 5 kicks in an hour, notify your doctor.   Complete by: As directed    LABOR:  When conractions begin, you should start to time them from the beginning of one contraction to the beginning  of the next.  When contractions are 5 - 10 minutes apart or less and have been regular for at least an hour, you should call your  health care provider.   Complete by: As directed    No sexual activity restrictions   Complete by: As directed    Notify physician for bleeding from the vagina   Complete by: As directed    Notify physician for blurring of vision or spots before the eyes   Complete by: As directed    Notify physician for chills or fever   Complete by: As directed    Notify physician for fainting spells, "black outs" or loss of consciousness   Complete by: As directed    Notify physician for increase in vaginal discharge   Complete by: As directed    Notify physician for leaking of fluid   Complete by: As directed    Notify physician for pain or burning when urinating   Complete by: As directed    Notify physician for pelvic pressure (sudden increase)   Complete by: As directed    Notify physician for severe or continued nausea or vomiting   Complete by: As directed    Notify physician for sudden gushing of fluid from the vagina (with or without continued leaking)   Complete  by: As directed    Notify physician for sudden, constant, or occasional abdominal pain   Complete by: As directed    Notify physician if baby moving less than usual   Complete by: As directed      Allergies as of 07/17/2019   No Known Allergies     Medication List    TAKE these medications   prenatal multivitamin Tabs tablet Take 1 tablet by mouth daily at 12 noon.      Follow up at [facility] as scheduled for NST and ROB  Total time spent taking care of this patient: 30 minutes  Signed: Tresea Mall, CNM  07/17/2019, 7:15 PM

## 2019-07-17 NOTE — Progress Notes (Signed)
RN and CNM at bedside to discuss plan of care for discharge and to discuss follow up care/induction date. Patient is scheduled for TOLAC induction of labor on 07/29/19 at 0800 and patient aware of induction date and COVID testing date. Patient states she has a follow up appointment next week and RN/CNM discussed importance of follow up appointment. Discussed reasons to return for evaluation and patient verbalized understanding. Patient discharged with significant other.

## 2019-07-17 NOTE — Progress Notes (Signed)
Routine Prenatal Care Visit  Subjective  Eileen Parsons is a 29 y.o. [id number] at [redacted]w[redacted]d being seen today for ongoing prenatal care.  She is currently monitored for the following issues for this high-risk pregnancy and has Supervision of high risk pregnancy, antepartum; History of shoulder dystocia in prior pregnancy, currently pregnant; Obesity affecting pregnancy, antepartum; BMI 40.0-44.9, adult (HCC); History of cesarean delivery; and Preterm contractions on their problem list.  ----------------------------------------------------------------------------------- Patient reports no complaints.   Contractions: Not present.  .  Movement: Present. Denies leaking of fluid.  ----------------------------------------------------------------------------------- The following portions of the patient's history were reviewed and updated as appropriate: allergies, current medications, past family history, past medical history, past social history, past surgical history and problem list. Problem list updated.   Objective  Blood pressure 128/72, height 5\' 3"  (1.6 m), weight (!) 303 lb 9.6 oz (137.7 kg), last menstrual period 10/25/2018, not currently breastfeeding. Pregravid weight 275 lb (124.7 kg) Total Weight Gain 28 lb 9.6 oz (13 kg) Urinalysis:      Fetal Status:     Movement: Present     General:  Alert, oriented and cooperative. Patient is in no acute distress.  Skin: Skin is warm and dry. No rash noted.   Cardiovascular: Normal heart rate noted  Respiratory: Normal respiratory effort, no problems with respiration noted  Abdomen: Soft, gravid, appropriate for gestational age. Pain/Pressure: Absent     Pelvic:  Cervical exam deferred        Extremities: Normal range of motion.     Mental Status: Normal mood and affect. Normal behavior. Normal judgment and thought content.     Assessment   29 y.o. 26 at [redacted]w[redacted]d by  08/01/2019, by Last Menstrual Period presenting for routine prenatal  visit  Plan   Pregnancy#5 Problems (from 10/25/18 to present)    Problem Noted Resolved   Obesity affecting pregnancy, antepartum 01/17/2019 by 01/19/2019, CNM No   Overview Addendum 07/03/2019  9:07 AM by 07/05/2019, MD    Growth [X]  28 weeks 1100g or 2lbs 7oz c/w 47.1%ile [x ] 32 weeks  Growth percentile is 39.5%.  BPD 5%. HC 3.4% EFW: 1821 g  ( 4 lb 0 oz ) [x ] 36 weeks  Growth percentile is 22.1%.  AC  13.3%, BPD: 3.4%. HC: <2.3% EFW: 2440 g ( 5 lb 6 oz )       Previous Version   Supervision of high risk pregnancy, antepartum 07/26/2017 by , CNM No   Overview Addendum 07/03/2019  9:29 AM by [name], MD    Clinic [facility] Prenatal Labs  Dating LMP = 14 week 07/05/2019 Blood type: O/Positive/-- (01/15 1054)   Genetic Screen  NIPS: normal XY Antibody:Negative (01/15 1054)  Anatomic 02-25-1983 complete 03/15/2019 Female gender, suboptimal views of aortic arch Rubella: 1.65 (01/15 1054) Varicella: Immune  GTT Early: 99               28 wk: 115 RPR: Non Reactive (01/15 1054)   Rhogam n/a HBsAg: Negative (01/15 1054)   Vaccines TDAP:   06/07/2019         Flu Shot: declined HIV: Non Reactive (01/15 1054)   Baby Food Plans breast, bottle fed last two infants                              GBS:   Contraception  paragard Pap:12/2018 normal  CBB  No Pelvis tested:  8lbs 11oz  CS/VBAC G2 c/s 02/01/2018, desires VBAC   Support Person Rodricus fiance          Previous Version       NST- nonreactive, sent to L&D for prolonged monitoring, consider CST and BPP.  NST: 130 bpm baseline, moderate variability, no accelerations, no decelerations. No response to stimulation.   Gestational age appropriate obstetric precautions including but not limited to vaginal bleeding, contractions, leaking of fluid and fetal movement were reviewed in detail with the patient.    Return in about 1 week (around 07/24/2019) for ROB in person.  Natale Milch MD [facility], [facility] 07/17/2019, 12:05 PM

## 2019-07-25 ENCOUNTER — Ambulatory Visit (INDEPENDENT_AMBULATORY_CARE_PROVIDER_SITE_OTHER): Payer: Medicaid Other

## 2019-07-25 ENCOUNTER — Other Ambulatory Visit: Payer: Self-pay | Admitting: Obstetrics and Gynecology

## 2019-07-25 ENCOUNTER — Ambulatory Visit (INDEPENDENT_AMBULATORY_CARE_PROVIDER_SITE_OTHER): Payer: Medicaid Other | Admitting: Obstetrics and Gynecology

## 2019-07-25 ENCOUNTER — Encounter: Payer: Self-pay | Admitting: Obstetrics and Gynecology

## 2019-07-25 ENCOUNTER — Other Ambulatory Visit: Payer: Self-pay

## 2019-07-25 ENCOUNTER — Other Ambulatory Visit (INDEPENDENT_AMBULATORY_CARE_PROVIDER_SITE_OTHER): Payer: Medicaid Other

## 2019-07-25 VITALS — BP 120/70 | Ht 66.0 in | Wt 305.0 lb

## 2019-07-25 DIAGNOSIS — O0990 Supervision of high risk pregnancy, unspecified, unspecified trimester: Secondary | ICD-10-CM

## 2019-07-25 DIAGNOSIS — Z3689 Encounter for other specified antenatal screening: Secondary | ICD-10-CM

## 2019-07-25 DIAGNOSIS — O0993 Supervision of high risk pregnancy, unspecified, third trimester: Secondary | ICD-10-CM

## 2019-07-25 DIAGNOSIS — Z3A39 39 weeks gestation of pregnancy: Secondary | ICD-10-CM

## 2019-07-25 DIAGNOSIS — Z98891 History of uterine scar from previous surgery: Secondary | ICD-10-CM

## 2019-07-25 DIAGNOSIS — O288 Other abnormal findings on antenatal screening of mother: Secondary | ICD-10-CM

## 2019-07-25 DIAGNOSIS — O09293 Supervision of pregnancy with other poor reproductive or obstetric history, third trimester: Secondary | ICD-10-CM

## 2019-07-25 DIAGNOSIS — O09299 Supervision of pregnancy with other poor reproductive or obstetric history, unspecified trimester: Secondary | ICD-10-CM

## 2019-07-25 LAB — FETAL NONSTRESS TEST

## 2019-07-25 LAB — POCT URINALYSIS DIPSTICK OB
Glucose, UA: NEGATIVE
POC,PROTEIN,UA: NEGATIVE

## 2019-07-25 NOTE — Progress Notes (Signed)
Routine Prenatal Care Visit  Subjective  Eileen Parsons is a 29 y.o. K2H0623 at [redacted]w[redacted]d being seen today for ongoing prenatal care.  She is currently monitored for the following issues for this high-risk pregnancy and has Supervision of high risk pregnancy, antepartum; History of shoulder dystocia in prior pregnancy, currently pregnant; Obesity affecting pregnancy, antepartum; BMI 40.0-44.9, adult (HCC); History of cesarean delivery; Preterm contractions; and Non-reactive NST (non-stress test) on their problem list.  ----------------------------------------------------------------------------------- Patient reports no complaints.   Contractions: Not present. Vag. Bleeding: None.  Movement: Present. Denies leaking of fluid.  ----------------------------------------------------------------------------------- The following portions of the patient's history were reviewed and updated as appropriate: allergies, current medications, past family history, past medical history, past social history, past surgical history and problem list. Problem list updated.   Objective  Blood pressure 120/70, height 5\' 6"  (1.676 m), weight (!) 305 lb (138.3 kg), last menstrual period 10/25/2018, not currently breastfeeding. Pregravid weight 275 lb (124.7 kg) Total Weight Gain 30 lb (13.6 kg) Urinalysis:      Fetal Status: Fetal Heart Rate (bpm): 135   Movement: Present  Presentation: Vertex  General:  Alert, oriented and cooperative. Patient is in no acute distress.  Skin: Skin is warm and dry. No rash noted.   Cardiovascular: Normal heart rate noted  Respiratory: Normal respiratory effort, no problems with respiration noted  Abdomen: Soft, gravid, appropriate for gestational age. Pain/Pressure: Absent     Pelvic:  Cervical exam deferred Dilation: 1 Effacement (%): 20 Station: -3  Extremities: Normal range of motion.     Mental Status: Normal mood and affect. Normal behavior. Normal judgment and thought content.       Assessment   29 y.o. 26 at [redacted]w[redacted]d by  08/01/2019, by Last Menstrual Period presenting for routine prenatal visit  Plan   Pregnancy#5 Problems (from 10/25/18 to present)    Problem Noted Resolved   Obesity affecting pregnancy, antepartum 01/17/2019 by 01/19/2019, CNM No   Overview Addendum 07/03/2019  9:07 AM by 07/05/2019, MD    Growth [X]  28 weeks 1100g or 2lbs 7oz c/w 47.1%ile [x ] 32 weeks  Growth percentile is 39.5%.  BPD 5%. HC 3.4% EFW: 1821 g  ( 4 lb 0 oz ) [x ] 36 weeks  Growth percentile is 22.1%.  AC  13.3%, BPD: 3.4%. HC: <2.3% EFW: 2440 g ( 5 lb 6 oz )       Previous Version   Supervision of high risk pregnancy, antepartum 07/26/2017 by , CNM No   Overview Addendum 07/03/2019  9:29 AM by [name], MD    [facility]inic [facility] Prenatal Labs  Dating LMP = 14 week 07/05/2019 Blood type: O/Positive/-- (01/15 1054)   Genetic Screen  NIPS: normal XY Antibody:Negative (01/15 1054)  Anatomic [birth year]1984 complete 03/15/2019 Female gender, suboptimal views of aortic arch Rubella: 1.65 (01/15 1054) Varicella: Immune  GTT Early: 99               28 wk: 115 RPR: Non Reactive (01/15 1054)   Rhogam n/a HBsAg: Negative (01/15 1054)   Vaccines TDAP:   06/07/2019         Flu Shot: declined HIV: Non Reactive (01/15 1054)   Baby Food Plans breast, bottle fed last two infants                              GBS:   Contraception  paragard Pap:12/2018 normal  CBB  No Pelvis tested: 8lbs 11oz  CS/VBAC G2 c/s 02/01/2018, desires VBAC   Support Person Rodricus fiance          Previous Version      NST: 135 bpm baseline, moderate variability, 10x10 accelerations, no decelerations. BPP 8/10  Patient has planned IOL for Monday.  Membranes swept at maternal request.  Gestational age appropriate obstetric precautions including but not limited to vaginal bleeding, contractions, leaking of fluid and fetal movement were reviewed in detail with the patient.    No  follow-ups on file.  Natale Milch MD [facility], [facility] 07/25/2019, 5:22 PM

## 2019-07-26 ENCOUNTER — Other Ambulatory Visit
Admission: RE | Admit: 2019-07-26 | Discharge: 2019-07-26 | Disposition: A | Discharge status: Home / Self Care | Payer: Medicaid Other | Source: Ambulatory Visit | Attending: Obstetrics and Gynecology | Admitting: Obstetrics and Gynecology

## 2019-07-26 DIAGNOSIS — Z20822 Contact with and (suspected) exposure to covid-19: Secondary | ICD-10-CM | POA: Insufficient documentation

## 2019-07-26 DIAGNOSIS — Z01812 Encounter for preprocedural laboratory examination: Secondary | ICD-10-CM | POA: Diagnosis present

## 2019-07-27 LAB — SARS CORONAVIRUS 2 (TAT 6-24 HRS): SARS Coronavirus 2: NEGATIVE

## 2019-07-29 ENCOUNTER — Inpatient Hospital Stay
Admission: EM | Admit: 2019-07-29 | Discharge: 2019-07-31 | DRG: 807 | Disposition: A | Discharge status: Home / Self Care | Payer: Medicaid Other | Attending: Obstetrics and Gynecology | Admitting: Obstetrics and Gynecology

## 2019-07-29 ENCOUNTER — Inpatient Hospital Stay: Payer: Medicaid Other | Admitting: Anesthesiology

## 2019-07-29 ENCOUNTER — Other Ambulatory Visit: Payer: Self-pay

## 2019-07-29 ENCOUNTER — Encounter: Payer: Self-pay | Admitting: Obstetrics and Gynecology

## 2019-07-29 DIAGNOSIS — O26893 Other specified pregnancy related conditions, third trimester: Secondary | ICD-10-CM | POA: Diagnosis present

## 2019-07-29 DIAGNOSIS — O0990 Supervision of high risk pregnancy, unspecified, unspecified trimester: Secondary | ICD-10-CM

## 2019-07-29 DIAGNOSIS — O34219 Maternal care for unspecified type scar from previous cesarean delivery: Principal | ICD-10-CM

## 2019-07-29 DIAGNOSIS — Z3A39 39 weeks gestation of pregnancy: Secondary | ICD-10-CM

## 2019-07-29 DIAGNOSIS — O99210 Obesity complicating pregnancy, unspecified trimester: Secondary | ICD-10-CM

## 2019-07-29 DIAGNOSIS — O99213 Obesity complicating pregnancy, third trimester: Secondary | ICD-10-CM | POA: Diagnosis not present

## 2019-07-29 DIAGNOSIS — O99214 Obesity complicating childbirth: Secondary | ICD-10-CM | POA: Diagnosis present

## 2019-07-29 DIAGNOSIS — Z3490 Encounter for supervision of normal pregnancy, unspecified, unspecified trimester: Secondary | ICD-10-CM | POA: Diagnosis present

## 2019-07-29 LAB — CBC
HCT: 37.1 % (ref 36.0–46.0)
Hemoglobin: 12.7 g/dL (ref 12.0–15.0)
MCH: 27.9 pg (ref 26.0–34.0)
MCHC: 34.2 g/dL (ref 30.0–36.0)
MCV: 81.4 fL (ref 80.0–100.0)
Platelets: 410 10*3/uL — ABNORMAL HIGH (ref 150–400)
RBC: 4.56 MIL/uL (ref 3.87–5.11)
RDW: 14.1 % (ref 11.5–15.5)
WBC: 15.1 10*3/uL — ABNORMAL HIGH (ref 4.0–10.5)
nRBC: 0 % (ref 0.0–0.2)

## 2019-07-29 LAB — TYPE AND SCREEN
ABO/RH(D): O POS
Antibody Screen: NEGATIVE

## 2019-07-29 MED ORDER — DIPHENHYDRAMINE HCL 50 MG/ML IJ SOLN: 12.5000 mg | INTRAMUSCULAR | Status: DC | PRN

## 2019-07-29 MED ORDER — EPHEDRINE 5 MG/ML INJ: 10.0000 mg | INTRAVENOUS | Status: DC | PRN

## 2019-07-29 MED ORDER — SODIUM CHLORIDE (PF) 0.9 % IJ SOLN
INTRAMUSCULAR | Status: AC
  Filled 2019-07-29: qty 50

## 2019-07-29 MED ORDER — ONDANSETRON HCL 4 MG/2ML IJ SOLN: 4.0000 mg | Freq: Four times a day (QID) | INTRAMUSCULAR | Status: DC | PRN

## 2019-07-29 MED ORDER — SODIUM CHLORIDE FLUSH 0.9 % IV SOLN
INTRAVENOUS | Status: AC
  Filled 2019-07-29: qty 10

## 2019-07-29 MED ORDER — BUTORPHANOL TARTRATE 1 MG/ML IJ SOLN: 1.0000 mg | INTRAMUSCULAR | Status: DC | PRN

## 2019-07-29 MED ORDER — OXYTOCIN 10 UNIT/ML IJ SOLN
INTRAMUSCULAR | Status: AC
  Filled 2019-07-29: qty 2

## 2019-07-29 MED ORDER — OXYTOCIN-SODIUM CHLORIDE 30-0.9 UT/500ML-% IV SOLN
1.0000 m[IU]/min | INTRAVENOUS | Status: DC
  Administered 2019-07-29: 1 m[IU]/min via INTRAVENOUS
  Administered 2019-07-30: 2 m[IU]/min via INTRAVENOUS
  Administered 2019-07-30: 4 m[IU]/min via INTRAVENOUS
  Filled 2019-07-29: qty 500

## 2019-07-29 MED ORDER — LACTATED RINGERS IV SOLN: INTRAVENOUS | Status: DC

## 2019-07-29 MED ORDER — MISOPROSTOL 200 MCG PO TABS
ORAL_TABLET | ORAL | Status: AC
  Filled 2019-07-29: qty 4

## 2019-07-29 MED ORDER — LIDOCAINE-EPINEPHRINE (PF) 1.5 %-1:200000 IJ SOLN
INTRAMUSCULAR | Status: DC | PRN
  Administered 2019-07-29: 3 mL via PERINEURAL

## 2019-07-29 MED ORDER — LIDOCAINE HCL (PF) 1 % IJ SOLN
INTRAMUSCULAR | Status: DC | PRN
  Administered 2019-07-29: 1.5 mL

## 2019-07-29 MED ORDER — LACTATED RINGERS IV SOLN
500.0000 mL | INTRAVENOUS | Status: DC | PRN
  Administered 2019-07-29: 500 mL via INTRAVENOUS
  Administered 2019-07-29: 1000 mL via INTRAVENOUS

## 2019-07-29 MED ORDER — PHENYLEPHRINE 40 MCG/ML (10ML) SYRINGE FOR IV PUSH (FOR BLOOD PRESSURE SUPPORT): 80.0000 ug | PREFILLED_SYRINGE | INTRAVENOUS | Status: DC | PRN

## 2019-07-29 MED ORDER — EPHEDRINE 5 MG/ML INJ
10.0000 mg | INTRAVENOUS | Status: DC | PRN
  Administered 2019-07-30: 10 mg via INTRAVENOUS
  Filled 2019-07-29: qty 4

## 2019-07-29 MED ORDER — LACTATED RINGERS IV SOLN: 500.0000 mL | Freq: Once | INTRAVENOUS | Status: DC

## 2019-07-29 MED ORDER — AMMONIA AROMATIC IN INHA
RESPIRATORY_TRACT | Status: AC
  Filled 2019-07-29: qty 10

## 2019-07-29 MED ORDER — FENTANYL 2.5 MCG/ML W/ROPIVACAINE 0.15% IN NS 100 ML EPIDURAL (ARMC)
EPIDURAL | Status: AC
  Filled 2019-07-29: qty 100

## 2019-07-29 MED ORDER — TERBUTALINE SULFATE 1 MG/ML IJ SOLN: 0.2500 mg | Freq: Once | INTRAMUSCULAR | Status: DC | PRN

## 2019-07-29 MED ORDER — LIDOCAINE HCL (PF) 1 % IJ SOLN
30.0000 mL | INTRAMUSCULAR | Status: DC | PRN
  Filled 2019-07-29: qty 30

## 2019-07-29 MED ORDER — FENTANYL 2.5 MCG/ML W/ROPIVACAINE 0.15% IN NS 100 ML EPIDURAL (ARMC)
12.0000 mL/h | EPIDURAL | Status: DC
  Filled 2019-07-29: qty 100

## 2019-07-29 MED ORDER — ACETAMINOPHEN 325 MG PO TABS: 650.0000 mg | ORAL_TABLET | ORAL | Status: DC | PRN

## 2019-07-29 MED ORDER — FENTANYL 2.5 MCG/ML W/ROPIVACAINE 0.15% IN NS 100 ML EPIDURAL (ARMC)
EPIDURAL | Status: DC | PRN
  Administered 2019-07-29: 12.5 mL/h via EPIDURAL

## 2019-07-29 MED ORDER — OXYTOCIN BOLUS FROM INFUSION
333.0000 mL | Freq: Once | INTRAVENOUS | Status: AC
  Administered 2019-07-30: 333 mL via INTRAVENOUS

## 2019-07-29 MED ORDER — OXYTOCIN-SODIUM CHLORIDE 30-0.9 UT/500ML-% IV SOLN
2.5000 [IU]/h | INTRAVENOUS | Status: DC
  Filled 2019-07-29: qty 500

## 2019-07-29 NOTE — Anesthesia Procedure Notes (Signed)
Epidural Patient location during procedure: OB Start time: 07/29/2019 10:00 PM End time: 07/29/2019 10:37 PM  Staffing Performed: anesthesiologist   Preanesthetic Checklist Completed: patient identified, IV checked, site marked, risks and benefits discussed, surgical consent, monitors and equipment checked, pre-op evaluation and timeout performed  Epidural Patient position: sitting Prep: Betadine Patient monitoring: heart rate, continuous pulse ox and blood pressure Approach: midline Location: L4-L5 Injection technique: LOR saline  Needle:  Needle type: Tuohy  Needle gauge: 18 G Needle length: 9 cm and 9 Needle insertion depth: 9 cm Catheter type: closed end flexible Catheter size: 20 Guage Catheter at skin depth: 15 cm Test dose: negative and 1.5% lidocaine with Epi 1:200 K  Assessment Events: blood not aspirated, injection not painful, no injection resistance, no paresthesia and negative IV test  Additional Notes   Patient tolerated the insertion well without complications.Reason for block:procedure for pain

## 2019-07-29 NOTE — Progress Notes (Signed)
Eileen Parsons is a 29 y.o. [id number] at [redacted]w[redacted]d by ultrasound admitted for induction of labor due to Elective at term. She is now being augmented with pitocin. Feeling her contractions but does not want any pain medication at this time.  Subjective:   Denies any vaginal bleeding or ROM.   Objective: BP 111/62   Pulse 91   Temp 98.5 F (36.9 C) (Oral)   Resp 17   Ht 5\' 6"  (1.676 m)   Wt (!) 137.9 kg   LMP 10/25/2018 (Exact Date)   BMI 49.07 kg/m  No intake/output data recorded. No intake/output data recorded.  FHT:  FHR: 130 baseline bpm, variability: moderate,  accelerations:  Present,  decelerations:  Absent UC:   Difficult to trace secondary to boy habitus. Approximately q 4-5 minutes, mild to palpation SVE:   Dilation: 3 Effacement (%): Thick Station: Ballotable Exam by:: [email or web address], CNM  Labs: Lab Results  Component Value Date   WBC 15.1 (H) 07/29/2019   HGB 12.7 07/29/2019   HCT 37.1 07/29/2019   MCV 81.4 07/29/2019   PLT 410 (H) 07/29/2019    Assessment / Plan: Induction of labor due to patient desire for TOLAC,  progressing well on pitocin  Labor: Progressing on Pitocin, will continue to increase then AROM  Fetal Wellbeing:  Category I Pain Control:  Labor support without medications I/D:  n/a Anticipated MOD:  VABC desired.  09/29/2019 07/29/2019, 8:27 PM

## 2019-07-29 NOTE — Progress Notes (Signed)
   Subjective:  Comfortable epidural no in place  Objective:   Vitals: Blood pressure 128/63, pulse (!) 134, temperature 98.1 F (36.7 C), temperature source Oral, resp. rate 16, height 5\' 6"  (1.676 m), weight (!) 137.9 kg, last menstrual period 10/25/2018, SpO2 99 %, not currently breastfeeding. General: NAD Abdomen: gravid, non-tender Cervical Exam:  Dilation: 3 Effacement (%): 50 Cervical Position: Posterior Station: Ballotable Presentation: Vertex Exam by:: [email or web address] RN  FHT: 130, moderate, +accels, occasional late decelerations Toco: period of tachysystole q52min, decreased to q87min with pitocin off  Results for orders placed or performed during the hospital encounter of 07/29/19 (from the past 24 hour(s))  CBC     Status: Abnormal   Collection Time: 07/29/19  8:55 AM  Result Value Ref Range   WBC 15.1 (H) 4.0 - 10.5 K/uL   RBC 4.56 3.87 - 5.11 MIL/uL   Hemoglobin 12.7 12.0 - 15.0 g/dL   HCT 09/29/19 36 - 46 %   MCV 81.4 80.0 - 100.0 fL   MCH 27.9 26.0 - 34.0 pg   MCHC 34.2 30.0 - 36.0 g/dL   RDW 35.3 29.9 - 24.2 %   Platelets 410 (H) 150 - 400 K/uL   nRBC 0.0 0.0 - 0.2 %  Type and screen     Status: None   Collection Time: 07/29/19  8:55 AM  Result Value Ref Range   ABO/RH(D) O POS    Antibody Screen NEG    Sample Expiration      08/01/2019,2359 Performed at [facility], [address] [place], [place]     Assessment:   29 y.o. [phone] [redacted]w[redacted]d elective IOL, TOLAC  Plan:   1) Labor - pitocin off, AROM and internal placed. Patient with tachysystole on being placed back on monitors following epidural.  Will titrate pitocin back up based on MVU  2) Fetus - cat II tacing  [redacted]w[redacted]d, MD, [facility], [facility] 07/29/2019, 10:58 PM

## 2019-07-29 NOTE — Progress Notes (Signed)
Eileen Parsons is a 29 y.o. L4T6256 at [redacted]w[redacted]d by ultrasound admitted for induction of labor due to Elective at term with a desire to successfully VBAC. She has a hx of CS and a hx of shoulder dystocia.   Subjective:  She denies any headache, blurred vision or decreased fetal movement this morning. She is having irregular, occasional contractions. She denies any LOF or vaginal bleeding. She ate breakfast early this morning.  Objective: BP 125/84 (BP Location: Left Arm)   Pulse (!) 116   Temp 98.6 F (37 C) (Oral)   Resp 18   Ht 5\' 6"  (1.676 m)   Wt (!) 137.9 kg   LMP 10/25/2018 (Exact Date)   BMI 49.07 kg/m  No intake/output data recorded. No intake/output data recorded.  FHT:  FHR: 145 bpm, variability: moderate,  accelerations:  Abscent,  decelerations:  Present occasional brief variable noted.  UC:   irregular, mild SVE:   Dilation: 2 Effacement (%): 20 Station: -3 Exam by:: [email or web address] CNM  Labs: Lab Results  Component Value Date   WBC 15.1 (H) 07/29/2019   HGB 12.7 07/29/2019   HCT 37.1 07/29/2019   MCV 81.4 07/29/2019   PLT 410 (H) 07/29/2019    Assessment / Plan: Induction of labor due to elective desire for TOL. Foley ball placed, Will commence with IV pitocin at low dose rate in several hours. Labor:  foley ball to start for cervical ripening Fetal Wellbeing:  Category II, occasional variable rare decel Pain Control:   no pain med in use at this time. Anticipate epidural  Anticipated MOD:  NSVD desired. TOLAC with careful monitoring Drs 09/29/2019 and Bonney Aid aware of patient's admission and desire for MD delivery.  Jerene Pitch 07/29/2019, 10:58 AM

## 2019-07-29 NOTE — Anesthesia Preprocedure Evaluation (Signed)
Anesthesia Evaluation  Patient identified by MRN, date of birth, ID band Patient awake    Reviewed: Allergy & Precautions, NPO status , Patient's Chart, lab work & pertinent test results  History of Anesthesia Complications Negative for: history of anesthetic complications  Airway Mallampati: III       Dental   Pulmonary neg sleep apnea, neg COPD, Not current smoker,           Cardiovascular (-) hypertension(-) Past MI and (-) CHF (-) dysrhythmias (-) Valvular Problems/Murmurs     Neuro/Psych neg Seizures    GI/Hepatic Neg liver ROS, neg GERD  ,  Endo/Other  neg diabetesMorbid obesity  Renal/GU negative Renal ROS     Musculoskeletal   Abdominal   Peds  Hematology   Anesthesia Other Findings   Reproductive/Obstetrics (+) Pregnancy (TOLAC)                             Anesthesia Physical Anesthesia Plan  ASA: III  Anesthesia Plan: Epidural   Post-op Pain Management:    Induction:   PONV Risk Score and Plan:   Airway Management Planned:   Additional Equipment:   Intra-op Plan:   Post-operative Plan:   Informed Consent: I have reviewed the patients History and Physical, chart, labs and discussed the procedure including the risks, benefits and alternatives for the proposed anesthesia with the patient or authorized representative who has indicated his/her understanding and acceptance.       Plan Discussed with:   Anesthesia Plan Comments:         Anesthesia Quick Evaluation

## 2019-07-29 NOTE — Progress Notes (Signed)
Eileen Parsons is a 29 y.o. V7S8270 at [redacted]w[redacted]d by ultrasound admitted for induction of labor due to Elective at term. She is a TOLAC candidate. Called to see the patient due to non reassuring tracing this last hour. In to evaluate the FHT tracing.  She is comfortable; has been feeling contractions on low dose pitocin which was just discontinued secondary to minimal variability and several "subtle" late decels noted with contractions.  Subjective:  She denies painful contractions. Denies any LOF or vaginal bleeding.   Objective: BP (!) 101/58 (BP Location: Right Arm)   Pulse 92   Temp 98.5 F (36.9 C) (Oral)   Resp 17   Ht 5\' 6"  (1.676 m)   Wt (!) 137.9 kg   LMP 10/25/2018 (Exact Date)   BMI 49.07 kg/m  No intake/output data recorded. No intake/output data recorded.  FHT:  FHR: 135 bpm, variability: minimal ,  accelerations:  Abscent,  decelerations:  Present several subtle lates UC:   regular, every 5-6 minutes SVE:   Dilation: 3 Effacement (%): Thick Station: Ballotable Exam by:: [email or web address], CNM    Labs: Lab Results  Component Value Date   WBC 15.1 (H) 07/29/2019   HGB 12.7 07/29/2019   HCT 37.1 07/29/2019   MCV 81.4 07/29/2019   PLT 410 (H) 07/29/2019    Assessment / Plan: Induction of labor due to patient desire at term,  With less reassuring fetal heart tracing. Dr. 09/29/2019 notified. Plan for MD evalaution of the EFM tracing. Pitocin has been discontinued.  Labor: Pitocin infusion discontinued  Fetal Wellbeing:  Category II Pain Control:  Labor support without medications I/D:  n/a Anticipated MOD:  VBAC desired  Dr. Bonney Aid on his way to the unit to see the strip and discuss POC with patient.  Bonney Aid 07/29/2019, 6:46 PM

## 2019-07-30 ENCOUNTER — Encounter: Payer: Self-pay | Admitting: Obstetrics and Gynecology

## 2019-07-30 ENCOUNTER — Other Ambulatory Visit: Payer: Medicaid Other

## 2019-07-30 ENCOUNTER — Encounter: Payer: Medicaid Other | Admitting: Obstetrics and Gynecology

## 2019-07-30 DIAGNOSIS — O99213 Obesity complicating pregnancy, third trimester: Secondary | ICD-10-CM

## 2019-07-30 DIAGNOSIS — O34219 Maternal care for unspecified type scar from previous cesarean delivery: Principal | ICD-10-CM

## 2019-07-30 DIAGNOSIS — Z3A39 39 weeks gestation of pregnancy: Secondary | ICD-10-CM

## 2019-07-30 LAB — CBC
HCT: 35.9 % — ABNORMAL LOW (ref 36.0–46.0)
Hemoglobin: 12.4 g/dL (ref 12.0–15.0)
MCH: 27.8 pg (ref 26.0–34.0)
MCHC: 34.5 g/dL (ref 30.0–36.0)
MCV: 80.5 fL (ref 80.0–100.0)
Platelets: 380 10*3/uL (ref 150–400)
RBC: 4.46 MIL/uL (ref 3.87–5.11)
RDW: 14.1 % (ref 11.5–15.5)
WBC: 19.2 10*3/uL — ABNORMAL HIGH (ref 4.0–10.5)
nRBC: 0 % (ref 0.0–0.2)

## 2019-07-30 LAB — RPR: RPR Ser Ql: NONREACTIVE

## 2019-07-30 MED ORDER — IBUPROFEN 600 MG PO TABS
600.0000 mg | ORAL_TABLET | Freq: Four times a day (QID) | ORAL | Status: DC
  Administered 2019-07-30 – 2019-07-31 (×5): 600 mg via ORAL
  Filled 2019-07-30 (×5): qty 1

## 2019-07-30 MED ORDER — OXYCODONE HCL 5 MG PO TABS: 10.0000 mg | ORAL_TABLET | ORAL | Status: DC | PRN

## 2019-07-30 MED ORDER — WITCH HAZEL-GLYCERIN EX PADS
1.0000 "application " | MEDICATED_PAD | CUTANEOUS | Status: DC | PRN
  Administered 2019-07-30: 1 via TOPICAL
  Filled 2019-07-30: qty 100

## 2019-07-30 MED ORDER — BENZOCAINE-MENTHOL 20-0.5 % EX AERO
1.0000 "application " | INHALATION_SPRAY | CUTANEOUS | Status: DC | PRN
  Administered 2019-07-30: 1 via TOPICAL
  Filled 2019-07-30: qty 56

## 2019-07-30 MED ORDER — ONDANSETRON HCL 4 MG/2ML IJ SOLN: 4.0000 mg | INTRAMUSCULAR | Status: DC | PRN

## 2019-07-30 MED ORDER — DOCUSATE SODIUM 100 MG PO CAPS
100.0000 mg | ORAL_CAPSULE | Freq: Two times a day (BID) | ORAL | Status: DC
  Filled 2019-07-30 (×2): qty 1

## 2019-07-30 MED ORDER — SIMETHICONE 80 MG PO CHEW: 80.0000 mg | CHEWABLE_TABLET | ORAL | Status: DC | PRN

## 2019-07-30 MED ORDER — DIPHENHYDRAMINE HCL 25 MG PO CAPS: 25.0000 mg | ORAL_CAPSULE | Freq: Four times a day (QID) | ORAL | Status: DC | PRN

## 2019-07-30 MED ORDER — ACETAMINOPHEN 500 MG PO TABS
1000.0000 mg | ORAL_TABLET | Freq: Four times a day (QID) | ORAL | Status: DC
  Administered 2019-07-30 – 2019-07-31 (×5): 1000 mg via ORAL
  Filled 2019-07-30 (×4): qty 2

## 2019-07-30 MED ORDER — OXYCODONE HCL 5 MG PO TABS: 5.0000 mg | ORAL_TABLET | ORAL | Status: DC | PRN

## 2019-07-30 MED ORDER — PRENATAL MULTIVITAMIN CH
1.0000 | ORAL_TABLET | Freq: Every day | ORAL | Status: DC
  Administered 2019-07-30: 1 via ORAL
  Filled 2019-07-30: qty 1

## 2019-07-30 MED ORDER — ONDANSETRON HCL 4 MG PO TABS: 4.0000 mg | ORAL_TABLET | ORAL | Status: DC | PRN

## 2019-07-30 MED ORDER — ZOLPIDEM TARTRATE 5 MG PO TABS: 5.0000 mg | ORAL_TABLET | Freq: Every evening | ORAL | Status: DC | PRN

## 2019-07-30 MED ORDER — DIBUCAINE (PERIANAL) 1 % EX OINT: 1.0000 "application " | TOPICAL_OINTMENT | CUTANEOUS | Status: DC | PRN

## 2019-07-30 MED ORDER — COCONUT OIL OIL
1.0000 "application " | TOPICAL_OIL | Status: DC | PRN
  Administered 2019-07-30: 1 via TOPICAL
  Filled 2019-07-30: qty 120

## 2019-07-30 NOTE — Discharge Summary (Signed)
Postpartum Discharge Summary  Date of Service updated 07/31/2019     Patient Name: Eileen Parsons DOB: 18-Apr-1990 MRN: [id number]  Date of admission: 07/29/2019 Delivery date:07/30/2019  Delivering provider: Adrian Prows R  Date of discharge: 07/31/2019  Admitting diagnosis: Encounter for elective induction of labor [Z34.90] Intrauterine pregnancy: [redacted]w[redacted]d    Secondary diagnosis:  Active Problems:   Encounter for elective induction of labor   Vaginal birth after cesarean delivery   Encounter for care or examination of lactating mother History of cesarean section Vaginal birth after cesarean section Additional problems: none    Discharge diagnosis: Term Pregnancy Delivered                                              Post partum procedures: none Augmentation: AROM, Pitocin and IP Foley Complications: None  Hospital course: Induction of Labor With Vaginal Delivery   29y.o. yo [id number] admitted to the hospital 07/29/2019 for induction of labor.  Indication for induction: Elective.  Patient had an uncomplicated labor course as follows: Membrane Rupture Time/Date: 10:50 PM ,07/29/2019   Delivery Method:Vaginal, Spontaneous  Episiotomy: None  Lacerations:  1st degree  Details of delivery can be found in separate delivery note.  Patient had a routine postpartum course. Patient is discharged home 07/31/19.  Newborn Data: Birth date:07/30/2019  Birth time:3:58 AM  Gender:Female  Living status:Living  Apgars:8 ,9  Weight:3270 g   Magnesium Sulfate received: No BMZ received: No Rhophylac:No MMR:No T-DaP:Given prenatally Flu: No Transfusion:No  Physical exam  Vitals:   07/30/19 1555 07/30/19 1905 07/30/19 2303 07/31/19 0817  BP: 112/65 104/76 (!) 100/50 120/71  Pulse: 83 84 80 85  Resp: '20 18 18 18  ' Temp: 98.2 F (36.8 C) 98.1 F (36.7 C) 98.7 F (37.1 C) 97.8 F (36.6 C)  TempSrc: Oral Oral Oral Oral  SpO2: 100% 100% 100% 100%  Weight:      Height:        General: alert, cooperative and no distress Lochia: appropriate Uterine Fundus: firm Incision: N/A DVT Evaluation: No evidence of DVT seen on physical exam. Labs: Lab Results  Component Value Date   WBC 19.2 (H) 07/30/2019   HGB 12.4 07/30/2019   HCT 35.9 (L) 07/30/2019   MCV 80.5 07/30/2019   PLT 380 07/30/2019   CMP Latest Ref Rng & Units 07/24/2017  Glucose 65 - 99 mg/dL 74  BUN 6 - 20 mg/dL 9  Creatinine 0.57 - 1.00 mg/dL 0.58  Sodium 134 - 144 mmol/L 137  Potassium 3.5 - 5.2 mmol/L 4.4  Chloride 96 - 106 mmol/L 103  CO2 20 - 29 mmol/L 19(L)  Calcium 8.7 - 10.2 mg/dL 9.4  Total Protein 6.0 - 8.5 g/dL 7.5  Total Bilirubin 0.0 - 1.2 mg/dL 0.3  Alkaline Phos 39 - 117 IU/L 65  AST 0 - 40 IU/L 11  ALT 0 - 32 IU/L 6   Edinburgh Score: Edinburgh Postnatal Depression Scale Screening Tool 07/31/2019  I have been able to laugh and see the funny side of things. 0  I have looked forward with enjoyment to things. 0  I have blamed myself unnecessarily when things went wrong. 1  I have been anxious or worried for no good reason. 0  I have felt scared or panicky for no good reason. 0  Things have been getting  on top of me. 1  I have been so unhappy that I have had difficulty sleeping. 0  I have felt sad or miserable. 1  I have been so unhappy that I have been crying. 0  The thought of harming myself has occurred to me. 0  Edinburgh Postnatal Depression Scale Total 3      After visit meds:  Allergies as of 07/31/2019   No Known Allergies     Medication List    TAKE these medications   prenatal multivitamin Tabs tablet Take 1 tablet by mouth daily at 12 noon.        Discharge home in stable condition Infant Feeding: breast and formula Infant Disposition:home with mother Discharge instruction: per After Visit Summary and Postpartum booklet. Activity: Advance as tolerated. Pelvic rest for 6 weeks.  Diet: routine diet Anticipated Birth Control: plans  Paragard Postpartum Appointment:2 weeks Additional Postpartum F/U: 6 weeks Future Appointments: Future Appointments  Date Time Provider [place]  08/13/2019  1:50 PM Homero Fellers, MD [facility] None   Follow up Visit:  Follow-up Information    Homero Fellers, MD. Go on 08/13/2019.   Specialty: Obstetrics and Gynecology Why: Follow-up appointment: 08/13/2019 at 1:50pm  Contact information: [address] [place] [address] [phone]                   07/31/2019 Rod Can, CNM

## 2019-07-30 NOTE — Discharge Instructions (Signed)
Discharge Instructions:   Follow-up Appointment: Tuesday, July 27th at 1:50pm with Dr. Jerene Pitch at [facility]!   If there are any new medications, they have been ordered and will be available for pickup at the listed pharmacy on your way home from the hospital.   Call the office if you have any of the following: headache, visual changes, fever >101.0 F, chills, shortness of breath, breast concerns, excessive vaginal bleeding, incision drainage or problems, leg pain or redness, depression or any other concerns. If you have vaginal discharge with an odor, let your doctor know.   It is normal to bleed for up to 6 weeks. You should not soak through more than 1 pad in 1 hour. If you have a blood clot larger than your fist with continued bleeding, call your doctor.   Activity: Do not lift > 15 lbs for 6 weeks (do not lift anything heavier than your baby). No intercourse, tampons, swimming pools, hot tubs, baths (only showers) for 6 weeks.  No driving for 1-2 weeks. Do not drive while taking narcotic or opioid pain medication.  Continue taking your prenatal vitamin, especially if breastfeeding. Increase calories and fluids (water) while breastfeeding.   Your milk will come in, in the next couple of days (right now it is colostrum). You may have a slight fever when your milk comes in, but it should go away on its own.  If it does not, and rises above 101 F please call the doctor. You will also feel achy and your breasts will be firm. They will also start to leak. If you are breastfeeding, continue as you have been and you can pump/express milk for comfort.   If you have too much milk, your breasts can become engorged, which could lead to mastitis. This is an infection of the milk ducts. It can be very painful and you will need to notify your doctor to obtain a prescription for antibiotics. You can also treat it with a shower or hot/cold compress.   For concerns about your baby, please call your  pediatrician.  For breastfeeding concerns, the lactation consultant can be reached at [phone].   Postpartum blues (feelings of happy one minute and sad another minute) are normal for the first few weeks but if it gets worse let your doctor know.   Congratulations! We enjoyed caring for you and your new bundle of joy!

## 2019-07-31 NOTE — Anesthesia Postprocedure Evaluation (Signed)
Anesthesia Post Note  Patient: Eileen Parsons  Procedure(s) Performed: AN AD HOC LABOR EPIDURAL  Patient location during evaluation: Mother Baby Anesthesia Type: Epidural Level of consciousness: awake, awake and alert and oriented Pain management: pain level controlled Vital Signs Assessment: post-procedure vital signs reviewed and stable Respiratory status: spontaneous breathing, nonlabored ventilation and respiratory function stable Cardiovascular status: blood pressure returned to baseline and stable Postop Assessment: no headache and no backache   No complications documented.   Last Vitals:  Vitals:   07/30/19 2303 07/31/19 0817  BP: (!) 100/50 120/71  Pulse: 80 85  Resp: 18 18  Temp: 37.1 C 36.6 C  SpO2: 100% 100%    Last Pain:  Vitals:   07/31/19 0817  TempSrc: Oral  PainSc:                  Ginger Carne

## 2019-07-31 NOTE — Progress Notes (Signed)
Patient discharged home with infant. Discharge instructions and prescriptions given and reviewed with patient. Patient verbalized understanding.   Follow-up appointment scheduled for Tuesday, July 27th at 1:50pm with Dr. Jerene Pitch at [facility].   Escorted out by staff.  Eileen Parsons

## 2019-08-13 ENCOUNTER — Telehealth: Payer: Self-pay | Admitting: Obstetrics and Gynecology

## 2019-08-13 ENCOUNTER — Encounter: Payer: Self-pay | Admitting: Obstetrics and Gynecology

## 2019-08-13 ENCOUNTER — Other Ambulatory Visit: Payer: Self-pay

## 2019-08-13 ENCOUNTER — Ambulatory Visit (INDEPENDENT_AMBULATORY_CARE_PROVIDER_SITE_OTHER): Payer: Medicaid Other | Admitting: Obstetrics and Gynecology

## 2019-08-13 NOTE — Telephone Encounter (Signed)
Noted. Will order to arrive by apt date/time. 

## 2019-08-13 NOTE — Telephone Encounter (Signed)
paragard with CRS on 8/24 at 1110

## 2019-08-13 NOTE — Progress Notes (Signed)
°  OBSTETRICS POSTPARTUM CLINIC PROGRESS NOTE  Subjective:     Eileen Parsons is a 29 y.o. [phone] female who presents for a postpartum visit. She is 2 weeks postpartum following a Term pregnancy and delivery by Vaginal, no problems at delivery.  I have fully reviewed the prenatal and intrapartum course. Anesthesia: epidural.  Postpartum course has been complicated by uncomplicated.  Baby is feeding by Bottle.  Bleeding: patient has not  resumed menses.  Bowel function is normal. Bladder function is normal.  Patient is not sexually active. Contraception method desired is IUD.  Postpartum depression screening: negative. Edinburgh 1.  The following portions of the patient's history were reviewed and updated as appropriate: allergies, current medications, past family history, past medical history, past social history, past surgical history and problem list.  Review of Systems Pertinent items are noted in HPI.  Objective:    BP 116/70    Ht 5\' 6"  (1.676 m)    Wt (!) 294 lb 3.2 oz (133.4 kg)    LMP 10/25/2018 (Exact Date)    Breastfeeding No    BMI 47.49 kg/m   General:  alert and no distress   Breasts:  inspection negative, no nipple discharge or bleeding, no masses or nodularity palpable  Lungs: clear to auscultation bilaterally  Heart:  regular rate and rhythm, S1, S2 normal, no murmur, click, rub or gallop  Abdomen: soft, non-tender; bowel sounds normal; no masses,  no organomegaly.                               Assessment:  Post Partum Care visit 1. Postpartum care following vaginal delivery     Plan:  See orders and Patient Instructions Follow up in: 4 weeks or as needed.   12/25/2018 MD, Adelene Idler OB/GYN, [facility] 08/13/2019 2:57 PM

## 2019-08-22 IMAGING — US US MFM OB LIMITED
1 series · 13 of 28 positions shown · non-contrast
Comparison: none

PATIENT INFO:

PERFORMED BY:
                   Sonographer                              GHANIM
SERVICE(S) PROVIDED:
  US MFM OB LIMITED                                    76815.01
 ----------------------------------------------------------------------
INDICATIONS:
  37 weeks gestation of pregnancy
FETAL EVALUATION:
 Num Of Fetuses:         1
 Fetal Heart Rate(bpm):  155
 Presentation:           Vertex
 Placenta:               Posterior
 Amniotic Fluid
 AFI FV:      Within normal limits
 AFI Sum(cm)     %Tile       Largest Pocket(cm)
 15.6            59
BIOPHYSICAL EVALUATION:
 Amniotic F.V:   Within normal limits       F. Tone:        Observed
 F. Movement:    Observed                   Score:          [DATE]
 F. Breathing:   Observed
GESTATIONAL AGE:
 Best:          37w 1d     Det. By:  Early Ultrasound         EDD:   02/18/18
                                     (07/05/17)
ANATOMY:
 Stomach:               Seen                   Bladder:                Seen

[Series 1: us mfm ob limited · 32 acquisitions, 13 frames shown]
[im 2/32]
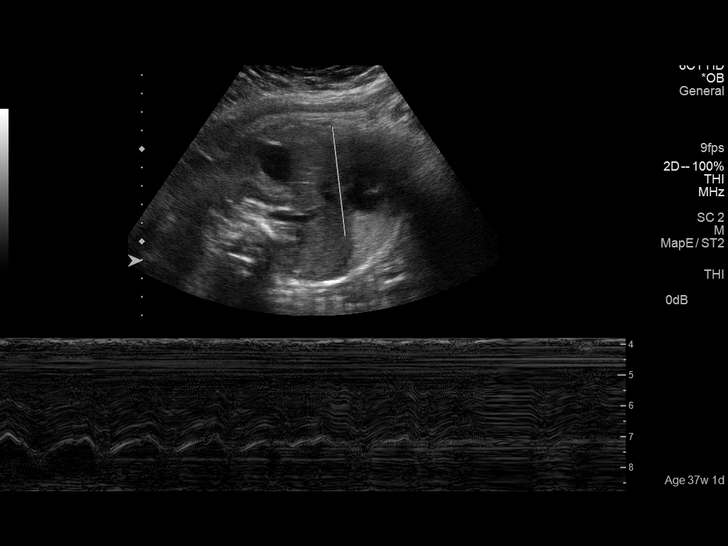
[im 4/32]
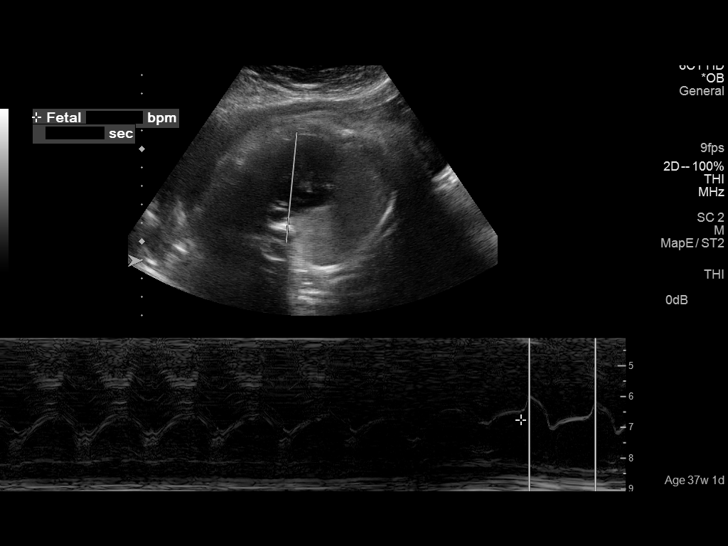
[im 6/32]
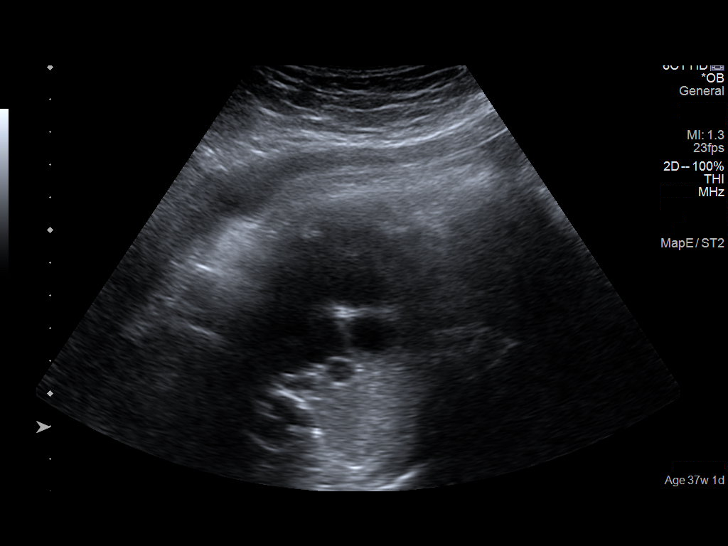
[im 9/32]
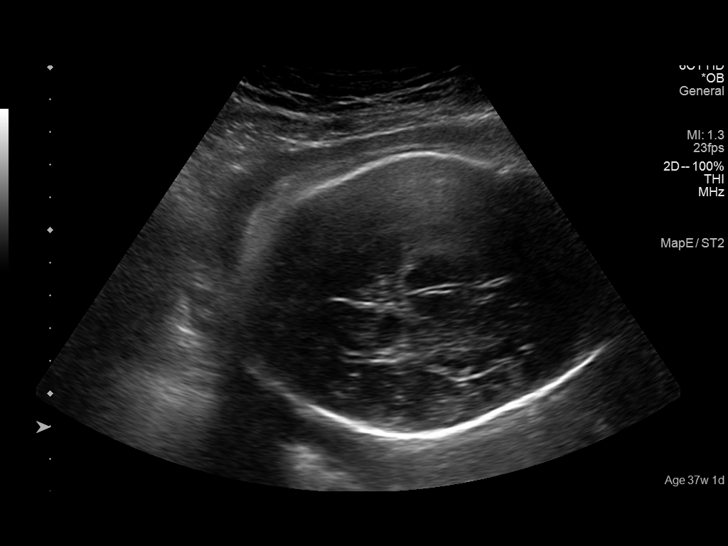
[im 11/32]
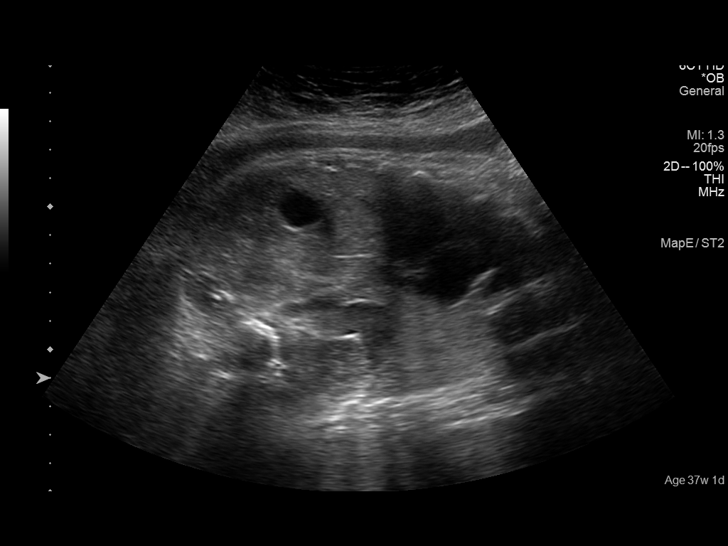
[im 13/32]
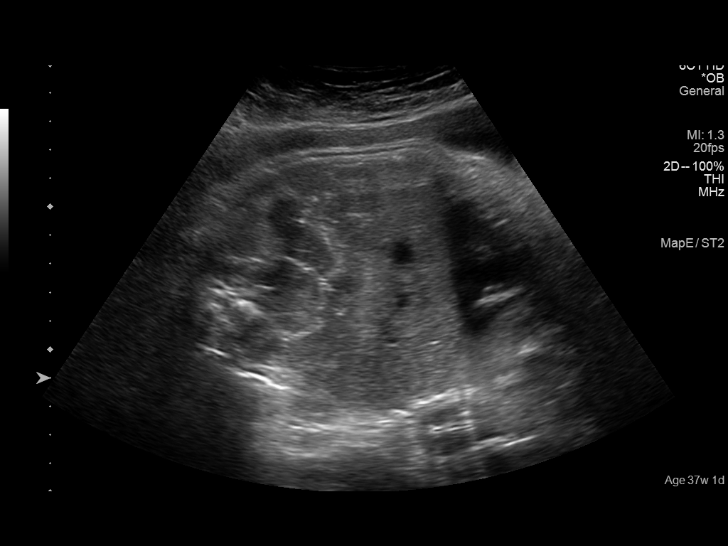
[im 17/32]
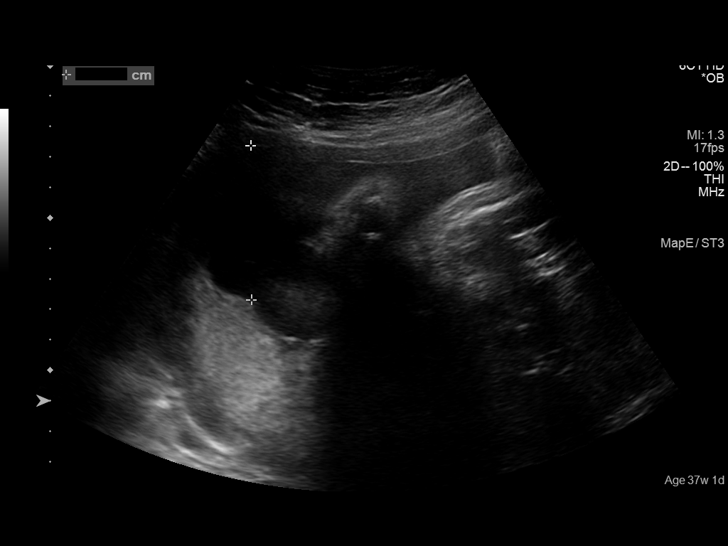
[im 19/32]
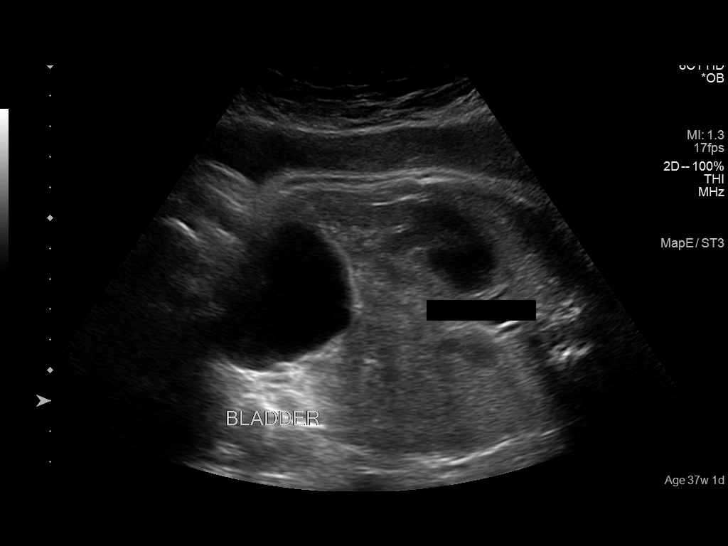
[im 21/32]
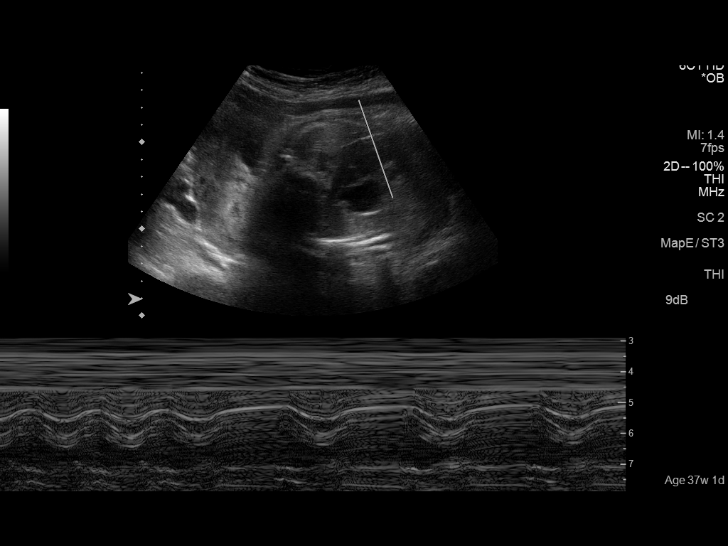
[im 23/32]
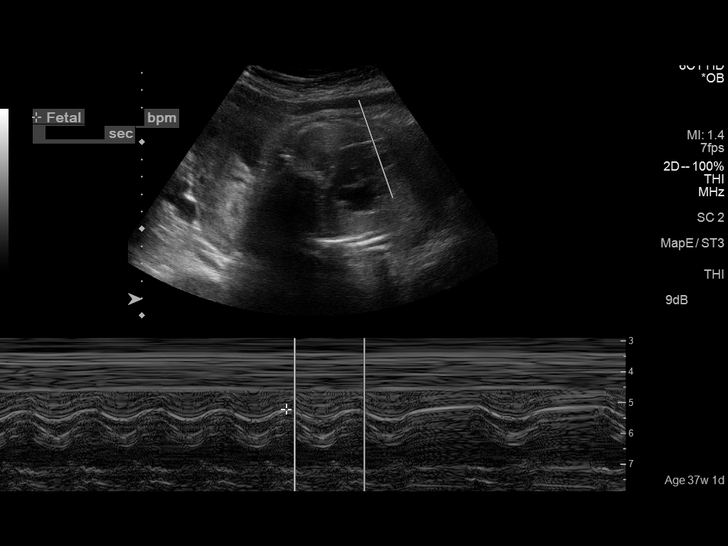
[im 26/32]
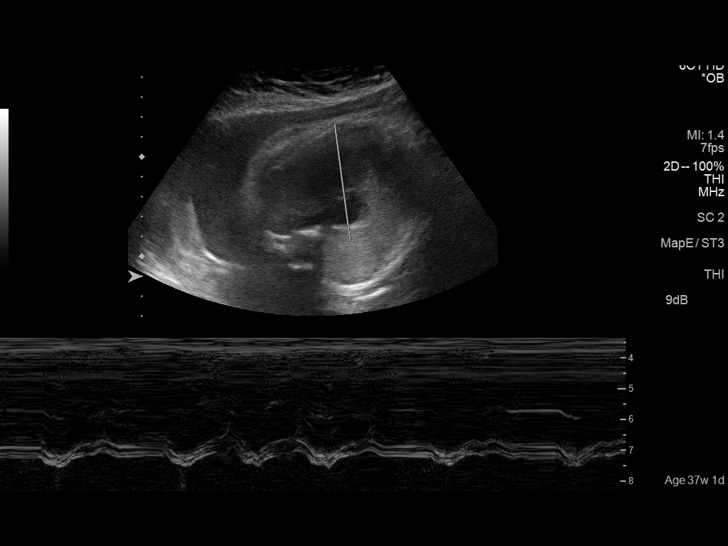
[im 28/32]
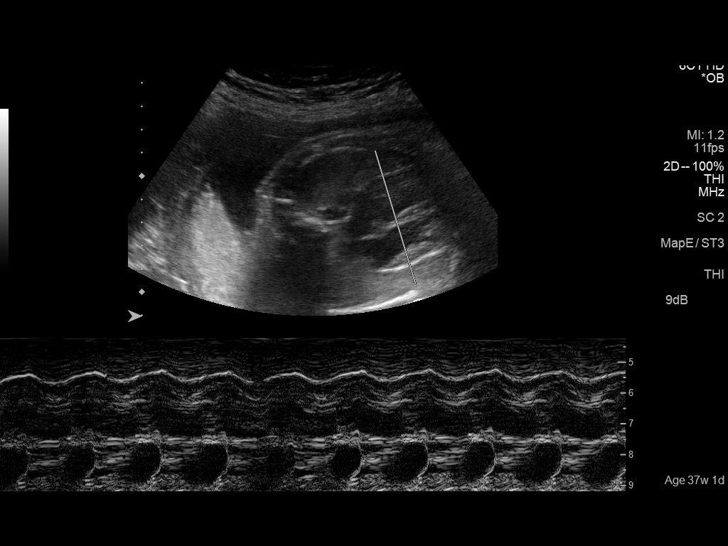
[im 30/32]
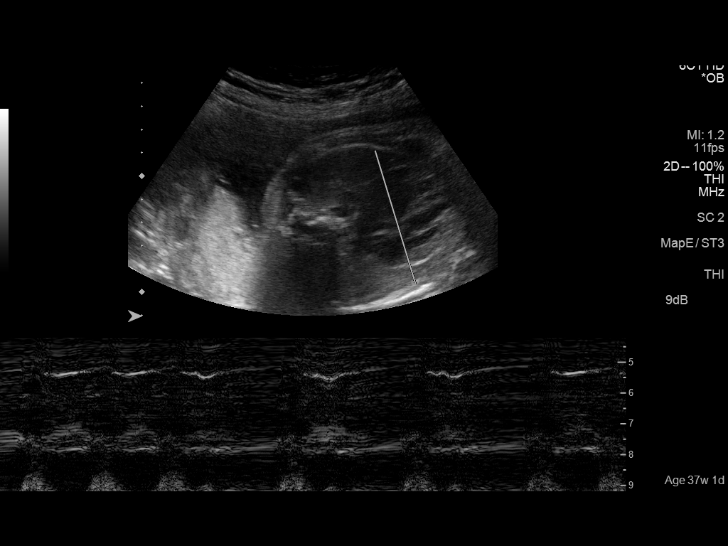

[13 of 28 positions shown; findings below may reference images not displayed]

IMPRESSION: Dear Dr William Rafael

 Thank you for referring your patient for a fetal biophysical
 profile (BPP) due to fetal heart rate arrhythmia noted on
 today's NST  at 37 [DATE] weeks ( NST done for obesity ) Prior
 NST was normal.

 There is a singleton gestation with normal amniotic fluid
 volume in cephalic position - no hydrops .

 The BPP was noted to be [DATE], which was reassuring.
 The periods of the FHR tracing in sinus rhythm showed a
 reactive tracing. However, there were periods with an abrupt
 drop to the 80-90s with abrupt recovery to the 140s .
 M mode was applied and freqent non conducted   Premature
 atrial contractions (PACs) were noted on M Mode.
 The patient was counseled that the majority of time, PACs
 are benign and usually resolve before or shortly after birth.  1-
 3% can develop a sustained tachyarrhythmia.  A fetal or
 neonatal  echo is nonetheless recommended.
 See consult note - in summary:
 Avoid caffeine
 check TSH, check U tox,  anti ro and la
 avoid beta agonists .
 Prolonged monitoring.
 I spoke with Dr William Rafael .
 The patient is early term.It might be helpful to delay delivery
 as PACs are usually benign and will frequently resolve, and
 the fetus is currently stable by BPP and pt prefers vagianl
 birth ( despite her h/o of shoulder dystocia that fetus was
 0111 g larger for that birth)
 I think Recommendations for  timing of delivery and mode of
 delivery depends on how much of the time the FHR remains
 in sinus rhythm . If it becomes too difficult to assess fetal well
 being then moving to cesarean is indicated to allow for
 neonatal cardiac assessment . Also if desired to have
 delivery closer to pediatric cardiology support we can care for
 her at [REDACTED].
 If PACs are rare then reasonable to discharge and have twice
 weekly NST follow up.

 [phone] pager

## 2019-09-10 ENCOUNTER — Ambulatory Visit: Payer: Self-pay | Admitting: Obstetrics and Gynecology

## 2019-09-12 NOTE — Telephone Encounter (Signed)
Per Epic, pt canceled apt 09/10/19 & r/s to 09/26/19. Noted. Will reserve closer to apt date/time.

## 2019-09-26 ENCOUNTER — Other Ambulatory Visit: Payer: Self-pay

## 2019-09-26 ENCOUNTER — Ambulatory Visit (INDEPENDENT_AMBULATORY_CARE_PROVIDER_SITE_OTHER): Payer: Medicaid Other | Admitting: Obstetrics and Gynecology

## 2019-09-26 ENCOUNTER — Encounter: Payer: Self-pay | Admitting: Obstetrics and Gynecology

## 2019-09-26 VITALS — BP 130/72 | HR 84 | Resp 18 | Ht 66.0 in | Wt 294.8 lb

## 2019-09-26 DIAGNOSIS — Z3040 Encounter for surveillance of contraceptives, unspecified: Secondary | ICD-10-CM | POA: Diagnosis not present

## 2019-09-26 DIAGNOSIS — Z30430 Encounter for insertion of intrauterine contraceptive device: Secondary | ICD-10-CM

## 2019-09-26 LAB — POCT URINE PREGNANCY: Preg Test, Ur: NEGATIVE

## 2019-09-26 NOTE — Progress Notes (Signed)
Pt here for PP and IUD placement.

## 2019-09-26 NOTE — Progress Notes (Signed)
  OBSTETRICS POSTPARTUM CLINIC PROGRESS NOTE  Subjective:     Eileen Parsons is a 29 y.o. [phone] female who presents for a postpartum visit. She is 8 weeks postpartum following a Term pregnancy and delivery by Vaginal, no problems at delivery.  I have fully reviewed the prenatal and intrapartum course. Anesthesia: epidural.  Postpartum course has been complicated by uncomplicated.  Bleeding: patient has  resumed menses.  Bowel function is normal. Bladder function is normal.  Patient is sexually active. Contraception method desired is IUD.  Postpartum depression screening: negative. Edinburgh 2.  The following portions of the patient's history were reviewed and updated as appropriate: allergies, current medications, past family history, past medical history, past social history, past surgical history and problem list.  Review of Systems Pertinent items are noted in HPI.  Objective:    BP 130/72   Pulse 84   Resp 18   Ht 5\' 6"  (1.676 m)   Wt 294 lb 12.8 oz (133.7 kg)   SpO2 98%   Breastfeeding No   BMI 47.58 kg/m   General:  alert and no distress   Breasts:  inspection negative, no nipple discharge or bleeding, no masses or nodularity palpable  Lungs: clear to auscultation bilaterally  Heart:  regular rate and rhythm, S1, S2 normal, no murmur, click, rub or gallop  Abdomen: soft, non-tender; bowel sounds normal; no masses,  no organomegaly.   Vulva:  normal  Vagina: normal vagina, no discharge, exudate, lesion, or erythema  Cervix:  no cervical motion tenderness and no lesions  Corpus: normal size, contour, position, consistency, mobility, non-tender  Adnexa:  normal adnexa and no mass, fullness, tenderness  Rectal Exam: Not performed.         IUD PROCEDURE NOTE:  Eileen Parsons is a 30 y.o. 26 here for IUD insertion. No GYN concerns.  Last pap smear was normal.  IUD Insertion Procedure Note Patient identified, informed consent performed, consent signed.   Discussed  risks of irregular bleeding, cramping, infection, malpositioning or misplacement of the IUD outside the uterus which may require further procedure such as laparoscopy, risk of failure <1%. Time out was performed.  Urine pregnancy test negative.  A bimanual exam showed the uterus to be anteverted.  Speculum placed in the vagina.  Cervix visualized.  Cleaned with Betadine x 2.  Grasped anteriorly with a single tooth tenaculum.  Uterus sounded to 9 cm.   Paragard IUD placed per manufacturer's recommendations.  Strings trimmed to 3 cm. Tenaculum was removed, good hemostasis noted.  Patient tolerated procedure well.   Patient was given post-procedure instructions.  She was advised to have backup contraception for one week.  Patient was also asked to check IUD strings periodically and follow up in 4 weeks for IUD check.   Assessment:  Post Partum Care visit 1. Encounter for surveillance of contraceptive device   - POCT urine pregnancy  2. Postpartum care following vaginal delivery    3. Encounter for IUD insertion     Plan:  See orders and Patient Instructions Follow up in: 4 weeks or as needed.   [id number] MD, Adelene Idler OB/GYN, [facility] 09/26/2019 12:18 PM

## 2019-09-26 NOTE — Patient Instructions (Signed)
Intrauterine Device Insertion, Care After  This sheet gives you information about how to care for yourself after your procedure. Your health care provider may also give you more specific instructions. If you have problems or questions, contact your health care provider. What can I expect after the procedure? After the procedure, it is common to have:  Cramps and pain in the abdomen.  Light bleeding (spotting) or heavier bleeding that is like your menstrual period. This may last for up to a few days.  Lower back pain.  Dizziness.  Headaches.  Nausea. Follow these instructions at home:  Before resuming sexual activity, check to make sure that you can feel the IUD string(s). You should be able to feel the end of the string(s) below the opening of your cervix. If your IUD string is in place, you may resume sexual activity. ? If you had a hormonal IUD inserted more than 7 days after your most recent period started, you will need to use a backup method of birth control for 7 days after IUD insertion. Ask your health care provider whether this applies to you.  Continue to check that the IUD is still in place by feeling for the string(s) after every menstrual period, or once a month.  Take over-the-counter and prescription medicines only as told by your health care provider.  Do not drive or use heavy machinery while taking prescription pain medicine.  Keep all follow-up visits as told by your health care provider. This is important. Contact a health care provider if:  You have bleeding that is heavier or lasts longer than a normal menstrual cycle.  You have a fever.  You have cramps or abdominal pain that get worse or do not get better with medicine.  You develop abdominal pain that is new or is not in the same area of earlier cramping and pain.  You feel lightheaded or weak.  You have abnormal or bad-smelling discharge from your vagina.  You have pain during sexual  activity.  You have any of the following problems with your IUD string(s): ? The string bothers or hurts you or your sexual partner. ? You cannot feel the string. ? The string has gotten longer.  You can feel the IUD in your vagina.  You think you may be pregnant, or you miss your menstrual period.  You think you may have an STI (sexually transmitted infection). Get help right away if:  You have flu-like symptoms.  You have a fever and chills.  You can feel that your IUD has slipped out of place. Summary  After the procedure, it is common to have cramps and pain in the abdomen. It is also common to have light bleeding (spotting) or heavier bleeding that is like your menstrual period.  Continue to check that the IUD is still in place by feeling for the string(s) after every menstrual period, or once a month.  Keep all follow-up visits as told by your health care provider. This is important.  Contact your health care provider if you have problems with your IUD string(s), such as the string getting longer or bothering you or your sexual partner. This information is not intended to replace advice given to you by your health care provider. Make sure you discuss any questions you have with your health care provider. Document Revised: 12/16/2016 Document Reviewed: 11/25/2015 Elsevier Patient Education  2020 Elsevier Inc.  

## 2019-10-24 ENCOUNTER — Other Ambulatory Visit: Payer: Self-pay

## 2019-10-24 ENCOUNTER — Encounter: Payer: Self-pay | Admitting: Obstetrics and Gynecology

## 2019-10-24 ENCOUNTER — Ambulatory Visit (INDEPENDENT_AMBULATORY_CARE_PROVIDER_SITE_OTHER): Payer: Medicaid Other | Admitting: Obstetrics and Gynecology

## 2019-10-24 VITALS — BP 126/90 | Ht 65.0 in | Wt 292.0 lb

## 2019-10-24 DIAGNOSIS — Z30431 Encounter for routine checking of intrauterine contraceptive device: Secondary | ICD-10-CM | POA: Diagnosis not present

## 2019-10-24 NOTE — Progress Notes (Signed)
  History of Present Illness:  Eileen Parsons is a 29 y.o. that had a Paragard IUD placed approximately 4 weeks ago. Since that time, she states that she has had little pain  PMHx: She  has a past medical history of History of shoulder dystocia in prior pregnancy, Medical history non-contributory, and Obesity. Also,  has a past surgical history that includes Mouth surgery and Cesarean section (N/A, 02/01/2018)., family history includes Alcohol abuse in her mother; Diabetes Mellitus II in her paternal aunt and paternal uncle; Kidney failure in her paternal grandfather; Lupus in her maternal grandmother.,  reports that she has never smoked. She has never used smokeless tobacco. She reports previous alcohol use. She reports that she does not use drugs. No outpatient medications have been marked as taking for the 10/24/19 encounter (Office Visit) with Natale Milch, MD.  .  Also, has No Known Allergies..  ROS  Physical Exam:  BP 126/90   Ht 5\' 5"  (1.651 m)   Wt 292 lb (132.5 kg)   BMI 48.59 kg/m  Body mass index is 48.59 kg/m. Constitutional: Well nourished, well developed female in no acute distress.  Abdomen: diffusely non tender to palpation, non distended, and no masses, hernias Neuro: Grossly intact Psych:  Normal mood and affect.    Pelvic exam:  Two IUD strings present seen coming from the cervical os. EGBUS, vaginal vault and cervix: within normal limits  Assessment: IUD strings present in proper location; pt doing well  Plan: She was told to continue to use barrier contraception, in order to prevent any STIs, and to take a home pregnancy test or call if she ever thinks she may be pregnant, and that her IUD expires in 10 years.  She was amenable to this plan and we will see her back in 1 year/PRN.  A total of 10 minutes were spent face-to-face with the patient as well as preparation, review, communication, and documentation during this encounter.   [place] MD,  Adelene Idler OB/GYN, [facility] 10/24/2019 4:08 PM

## 2020-01-12 ENCOUNTER — Other Ambulatory Visit: Payer: Self-pay

## 2020-01-12 ENCOUNTER — Emergency Department
Admission: EM | Admit: 2020-01-12 | Discharge: 2020-01-12 | Disposition: A | Discharge status: Home / Self Care | Payer: Medicaid Other | Attending: Emergency Medicine | Admitting: Emergency Medicine

## 2020-01-12 ENCOUNTER — Encounter: Payer: Self-pay | Admitting: Emergency Medicine

## 2020-01-12 DIAGNOSIS — U071 COVID-19: Secondary | ICD-10-CM | POA: Insufficient documentation

## 2020-01-12 DIAGNOSIS — R000 Tachycardia, unspecified: Secondary | ICD-10-CM | POA: Insufficient documentation

## 2020-01-12 DIAGNOSIS — R059 Cough, unspecified: Secondary | ICD-10-CM | POA: Diagnosis present

## 2020-01-12 LAB — RESP PANEL BY RT-PCR (FLU A&B, COVID) ARPGX2
Influenza A by PCR: NEGATIVE
Influenza B by PCR: NEGATIVE
SARS Coronavirus 2 by RT PCR: POSITIVE — AB

## 2020-01-12 MED ORDER — PSEUDOEPH-BROMPHEN-DM 30-2-10 MG/5ML PO SYRP
5.0000 mL | ORAL_SOLUTION | Freq: Four times a day (QID) | ORAL | 0 refills | Status: DC | PRN
Start: 2020-01-12 — End: 2022-07-29

## 2020-01-12 MED ORDER — IBUPROFEN 800 MG PO TABS
800.0000 mg | ORAL_TABLET | Freq: Once | ORAL | Status: AC
  Administered 2020-01-12: 800 mg via ORAL
  Filled 2020-01-12: qty 1

## 2020-01-12 MED ORDER — ACETAMINOPHEN 325 MG PO TABS
650.0000 mg | ORAL_TABLET | Freq: Once | ORAL | Status: AC | PRN
  Administered 2020-01-12: 650 mg via ORAL
  Filled 2020-01-12: qty 2

## 2020-01-12 NOTE — ED Triage Notes (Signed)
Pt to ED via POV stating that she is having COVID symptoms. Pt reports chills, fever, cough, and body aches. Pt states that she was unable to get an appt to be tested today and needs to be tested before she goes to work tomorrow. Pt is in NAD.

## 2020-01-12 NOTE — ED Provider Notes (Signed)
[facility] EMERGENCY DEPARTMENT Provider Note   CSN: [id number] Arrival date & time: 01/12/20  1634     History Chief Complaint  Patient presents with  . Chills  . Cough    Eileen Parsons is a 29 y.o. female presents to the emergency department evaluation of cough body aches fevers chills.  Symptoms began yesterday.  She has been fully vaccinated.  She works in a rehab facility.  She has not taken medications for her symptoms.  She was given Tylenol in triage with temp down from 103-1 02.  She denies any abdominal pain nausea vomiting diarrhea.  No skin rashes.  She has no past medical history or allergies.  HPI     Past Medical History:  Diagnosis Date  . History of shoulder dystocia in prior pregnancy    with first pregnancy  . Medical history non-contributory   . Obesity    BMI=40.25 kb/m2    Patient Active Problem List   Diagnosis Date Noted  . Encounter for care or examination of lactating mother 07/31/2019  . Vaginal birth after cesarean delivery   . Encounter for elective induction of labor 07/29/2019  . Non-reactive NST (non-stress test) 07/17/2019  . Preterm contractions 07/10/2019  . History of cesarean delivery 02/01/2019  . Obesity affecting pregnancy, antepartum 01/17/2019  . BMI 40.0-44.9, adult (HCC) 01/17/2019  . Supervision of high risk pregnancy, antepartum 07/26/2017  . History of shoulder dystocia in prior pregnancy, currently pregnant 07/26/2017    Past Surgical History:  Procedure Laterality Date  . CESAREAN SECTION N/A 02/01/2018   Procedure: CESAREAN SECTION;  Surgeon: Natale Milch, MD;  Location: [facility] ORS;  Service: Obstetrics;  Laterality: N/A;  . MOUTH SURGERY       OB History    Gravida  5   Para  3   Term  3   Preterm      AB  2   Living  3     SAB  2   IAB      Ectopic      Multiple  0   Live Births  3           Family History  Problem Relation Age of Onset  . Alcohol abuse  Mother   . Diabetes Mellitus II Paternal Aunt   . Diabetes Mellitus II Paternal Uncle   . Lupus Maternal Grandmother   . Kidney failure Paternal Grandfather   . Breast cancer Neg Hx   . Ovarian cancer Neg Hx   . Colon cancer Neg Hx     Social History   Tobacco Use  . Smoking status: Never Smoker  . Smokeless tobacco: Never Used  Vaping Use  . Vaping Use: Never used  Substance Use Topics  . Alcohol use: Not Currently    Comment: weekends-stopped alcohol with pregnancy  . Drug use: Never    Home Medications Prior to Admission medications   Medication Sig Start Date End Date Taking? Authorizing Provider  brompheniramine-pseudoephedrine-DM 30-2-10 MG/5ML syrup Take 5 mLs by mouth 4 (four) times daily as needed. 01/12/20   Evon Slack, PA-C    Allergies    Patient has no known allergies.  Review of Systems   Review of Systems  Constitutional: Positive for fever. Negative for chills.  HENT: Positive for congestion and rhinorrhea. Negative for ear discharge, sinus pressure, sinus pain, sore throat, trouble swallowing and voice change.   Respiratory: Positive for cough. Negative for shortness of breath, wheezing and  stridor.   Cardiovascular: Negative for chest pain.  Gastrointestinal: Negative for abdominal pain, diarrhea, nausea and vomiting.  Genitourinary: Negative for dysuria, flank pain and pelvic pain.  Musculoskeletal: Positive for myalgias. Negative for back pain.  Skin: Negative for rash.  Neurological: Negative for dizziness and headaches.    Physical Exam Updated Vital Signs BP 136/79   Pulse (!) 111   Temp (!) 102.2 F (39 C) (Oral)   Resp 16   Ht 5\' 6"  (1.676 m)   Wt 136.1 kg   LMP 01/10/2020   SpO2 98%   BMI 48.42 kg/m   Physical Exam Constitutional:      Appearance: She is well-developed and well-nourished.  HENT:     Head: Normocephalic and atraumatic.     Mouth/Throat:     Mouth: Mucous membranes are moist.     Pharynx: No oropharyngeal  exudate or posterior oropharyngeal erythema.  Eyes:     Conjunctiva/sclera: Conjunctivae normal.  Cardiovascular:     Rate and Rhythm: Tachycardia present.  Pulmonary:     Effort: Pulmonary effort is normal. No respiratory distress.     Breath sounds: Normal breath sounds. No stridor. No wheezing, rhonchi or rales.  Chest:     Chest wall: No tenderness.  Musculoskeletal:        General: Normal range of motion.     Cervical back: Normal range of motion.  Skin:    General: Skin is warm.     Findings: No rash.  Neurological:     Mental Status: She is alert and oriented to person, place, and time.  Psychiatric:        Mood and Affect: Mood and affect normal.        Behavior: Behavior normal.        Thought Content: Thought content normal.     ED Results / Procedures / Treatments   Labs (all labs ordered are listed, but only abnormal results are displayed) Labs Reviewed  RESP PANEL BY RT-PCR (FLU A&B, COVID) ARPGX2 - Abnormal; Notable for the following components:      Result Value   SARS Coronavirus 2 by RT PCR POSITIVE (*)    All other components within normal limits    EKG None  Radiology No results found.  Procedures Procedures (including critical care time)  Medications Ordered in ED Medications  ibuprofen (ADVIL) tablet 800 mg (has no administration in time range)  acetaminophen (TYLENOL) tablet 650 mg (650 mg Oral Given 01/12/20 1654)    ED Course  I have reviewed the triage vital signs and the nursing notes.  Pertinent labs & imaging results that were available during my care of the patient were reviewed by me and considered in my medical decision making (see chart for details).    MDM Rules/Calculators/A&P                          29 year old female with flulike symptoms that began yesterday.  She is positive for Covid.  Patient has been febrile up to 103, no medications have been taken.  She is given Tylenol in triage, temperature coming down.  Patient  appears well, no respiratory distress.  She is fully vaccinated and has no past medical history.  Lungs are clear to auscultation bilaterally.  Patient's discharged home with informational quarantine as well as prescription for Bromfed.  She understands signs symptoms return to the ER for. Final Clinical Impression(s) / ED Diagnoses Final diagnoses:  COVID-19  Rx / DC Orders ED Discharge Orders         Ordered    brompheniramine-pseudoephedrine-DM 30-2-10 MG/5ML syrup  4 times daily PRN        01/12/20 2054           Ronnette Juniper 01/12/20 2100    Merwyn Katos, MD 01/12/20 2245

## 2020-01-12 NOTE — Discharge Instructions (Signed)
Please quarantine for 10 days.  Alternate Tylenol and ibuprofen every 3 hours of issue drinking lots of fluids.  If fevers above 102 despite Tylenol and ibuprofen please return to the emergency department.  Also return to the ER for any chest pain shortness of breath or any worsening symptoms or urgent changes in your health.

## 2020-11-11 NOTE — Telephone Encounter (Signed)
Paragared rcvd/charged 09/26/19

## 2021-11-16 ENCOUNTER — Emergency Department
Admission: EM | Admit: 2021-11-16 | Discharge: 2021-11-16 | Disposition: A | Discharge status: Home / Self Care | Payer: Medicaid Other | Attending: Emergency Medicine | Admitting: Emergency Medicine

## 2021-11-16 ENCOUNTER — Emergency Department: Payer: Medicaid Other

## 2021-11-16 ENCOUNTER — Encounter: Payer: Self-pay | Admitting: Emergency Medicine

## 2021-11-16 DIAGNOSIS — S62665A Nondisplaced fracture of distal phalanx of left ring finger, initial encounter for closed fracture: Secondary | ICD-10-CM | POA: Insufficient documentation

## 2021-11-16 DIAGNOSIS — W230XXA Caught, crushed, jammed, or pinched between moving objects, initial encounter: Secondary | ICD-10-CM | POA: Diagnosis not present

## 2021-11-16 DIAGNOSIS — S62639A Displaced fracture of distal phalanx of unspecified finger, initial encounter for closed fracture: Secondary | ICD-10-CM

## 2021-11-16 DIAGNOSIS — S6992XA Unspecified injury of left wrist, hand and finger(s), initial encounter: Secondary | ICD-10-CM | POA: Diagnosis present

## 2021-11-16 MED ORDER — ACETAMINOPHEN 325 MG PO TABS
650.0000 mg | ORAL_TABLET | Freq: Once | ORAL | Status: AC
  Administered 2021-11-16: 650 mg via ORAL
  Filled 2021-11-16: qty 2

## 2021-11-16 NOTE — ED Notes (Signed)
E signature pad not working. Pt educated on discharge instructions and verbalized understanding.  

## 2021-11-16 NOTE — ED Provider Notes (Signed)
[facility] Provider Note    Event Date/Time   First MD Initiated Contact with Patient 11/16/21 2138     (approximate)   History   Finger Injury   HPI  Eileen Parsons is a 31 y.o. female right-hand-dominant female who presents today for evaluation of left fourth finger injury.  Patient reports that she accidentally slammed her finger in the door.  There was no open wounds.  She reports that she has pain to the tip of her finger only.  She stable to flex and extend her finger normally.  No paresthesias.  No bleeding.  Patient Active Problem List   Diagnosis Date Noted   Encounter for care or examination of lactating mother 07/31/2019   Vaginal birth after cesarean delivery    Encounter for elective induction of labor 07/29/2019   Non-reactive NST (non-stress test) 07/17/2019   Preterm contractions 07/10/2019   History of cesarean delivery 02/01/2019   Obesity affecting pregnancy, antepartum 01/17/2019   BMI 40.0-44.9, adult ([place]) 01/17/2019   Supervision of high risk pregnancy, antepartum 07/26/2017   History of shoulder dystocia in prior pregnancy, currently pregnant 07/26/2017        Physical Exam   Triage Vital Signs: ED Triage Vitals  Enc Vitals Group     BP 11/16/21 2114 (!) 137/94     Pulse Rate 11/16/21 2114 88     Resp 11/16/21 2114 18     Temp 11/16/21 2114 98.6 F (37 C)     Temp src --      SpO2 11/16/21 2114 100 %     Weight 11/16/21 2115 293 lb 3.4 oz (133 kg)     Height 11/16/21 2115 5\' 6"  (1.676 m)     Head Circumference --      Peak Flow --      Pain Score 11/16/21 2113 10     Pain Loc --      Pain Edu? --      Excl. in [place]? --     Most recent vital signs: Vitals:   11/16/21 2114  BP: (!) 137/94  Pulse: 88  Resp: 18  Temp: 98.6 F (37 C)  SpO2: 100%    Physical Exam Vitals and nursing note reviewed.  Constitutional:      General: Awake and alert. No acute distress.    Appearance: Normal appearance. The  patient is normal weight.  HENT:     Head: Normocephalic and atraumatic.     Mouth: Mucous membranes are moist.  Eyes:     General: PERRL. Normal EOMs        Right eye: No discharge.        Left eye: No discharge.     Conjunctiva/sclera: Conjunctivae normal.  Cardiovascular:     Rate and Rhythm: Normal rate and regular rhythm.     Pulses: Normal pulses.  Pulmonary:     Effort: Pulmonary effort is normal. No respiratory distress.  Abdominal:     Abdomen is soft. There is no abdominal tenderness. Musculoskeletal:        General: No swelling. Normal range of motion.     Cervical back: Normal range of motion and neck supple.  Left hand: Fourth finger with faint ecchymosis noted to the pad of the finger.  No subungual hematoma.  No open wounds.  No nailbed injury.  Patient is able to flex and extend at isolated PIP and DIP against resistance.  Sensation intact light touch distally.  Normal capillary refill.  Skin:    General: Skin is warm and dry.     Capillary Refill: Capillary refill takes less than 2 seconds.     Findings: No rash.  Neurological:     Mental Status: The patient is awake and alert.      ED Results / Procedures / Treatments   Labs (all labs ordered are listed, but only abnormal results are displayed) Labs Reviewed - No data to display   EKG     RADIOLOGY I independently reviewed and interpreted imaging and agree with radiologists findings.     PROCEDURES:  Critical Care performed:   Procedures   MEDICATIONS ORDERED IN ED: Medications  acetaminophen (TYLENOL) tablet 650 mg (650 mg Oral Given 11/16/21 2230)     IMPRESSION / MDM / ASSESSMENT AND PLAN / ED COURSE  I reviewed the triage vital signs and the nursing notes.   Differential diagnosis includes, but is not limited to, fracture, dislocation, contusion.  Patient is awake and alert, hemodynamically stable and neurovascularly intact.  Sensation intact to light touch throughout finger, do  not suspect nerve injury.  Able to flex and extend at isolated DIP and PIP, do not suspect tendon injury.  No fingernail or nailbed involvement.  X-ray confirms distal tuft fracture.  She was placed in a finger splint.  We discussed rest, ice, elevation.  We also discussed return precautions.  Patient understands and agrees with plan.  She was discharged in stable condition.    Patient's presentation is most consistent with acute complicated illness / injury requiring diagnostic workup.    FINAL CLINICAL IMPRESSION(S) / ED DIAGNOSES   Final diagnoses:  Closed fracture of tuft of distal phalanx of finger     Rx / DC Orders   ED Discharge Orders     None        Note:  This document was prepared using Dragon voice recognition software and may include unintentional dictation errors.   Emeline Gins 11/16/21 2239    Rada Hay, MD 11/16/21 [phone]

## 2021-11-16 NOTE — Discharge Instructions (Signed)
You have a small fracture to the tip of your finger.  Please wear the splint for extra support.  Rest, ice, elevate your finger.  Please return for any new, worsening, or changing symptoms or other concerns.  It was a pleasure caring for you today.

## 2021-11-16 NOTE — ED Triage Notes (Signed)
Pt presents via POV with complaints of left ring finger injury that happened 2 hours ago. She notes closing her finger in a car door - blister forming on the tip of her finger. Able to move fingers but endorses pain - pain 10/10. Denies bleeding.

## 2022-07-29 ENCOUNTER — Encounter: Payer: Self-pay | Admitting: Emergency Medicine

## 2022-07-29 ENCOUNTER — Emergency Department: Payer: Self-pay

## 2022-07-29 ENCOUNTER — Emergency Department
Admission: EM | Admit: 2022-07-29 | Discharge: 2022-07-29 | Disposition: A | Discharge status: Home / Self Care | Payer: Self-pay | Attending: Emergency Medicine | Admitting: Emergency Medicine

## 2022-07-29 ENCOUNTER — Other Ambulatory Visit: Payer: Self-pay

## 2022-07-29 DIAGNOSIS — M79604 Pain in right leg: Secondary | ICD-10-CM | POA: Insufficient documentation

## 2022-07-29 DIAGNOSIS — M79651 Pain in right thigh: Secondary | ICD-10-CM | POA: Insufficient documentation

## 2022-07-29 MED ORDER — NAPROXEN 500 MG PO TABS: 500.0000 mg | ORAL_TABLET | Freq: Two times a day (BID) | ORAL | 0 refills | Status: DC

## 2022-07-29 MED ORDER — NAPROXEN 500 MG PO TABS: 500.0000 mg | ORAL_TABLET | Freq: Two times a day (BID) | ORAL | 0 refills | Status: AC

## 2022-07-29 NOTE — ED Triage Notes (Signed)
Pt here with right thigh pain that started Wed. Pt states she was at work when the pain began. Pt denies fall or injury. Pt walking with a limp.

## 2022-07-29 NOTE — Discharge Instructions (Signed)
Your ultrasound was negative for blood clot.  You may continue to take Tylenol/ibuprofen per package instructions as needed for pain.  Please return for any new, worsening, or change in symptoms or other concerns.  It was a pleasure caring for you today.

## 2022-07-29 NOTE — ED Provider Notes (Signed)
[facility] Provider Note    Event Date/Time   First MD Initiated Contact with Patient 07/29/22 [phone]     (approximate)   History   Leg Pain   HPI  Eileen Parsons is a 32 y.o. female with a past medical history of obesity who presents today for evaluation of right thigh pain.  Patient reports that she first noticed this 2 days ago.  She reports that she has been walking with a limp due to the discomfort.  She has not noticed any skin changes.  She denies any trauma to the area or any recent injuries.  She denies back pain.  She has not had any weakness or paresthesias.  No history of DVT in herself or in any family members.  She denies any recent periods of immobilization.  Patient Active Problem List   Diagnosis Date Noted   Encounter for care or examination of lactating mother 07/31/2019   Vaginal birth after cesarean delivery    Encounter for elective induction of labor 07/29/2019   Non-reactive NST (non-stress test) 07/17/2019   Preterm contractions 07/10/2019   History of cesarean delivery 02/01/2019   Obesity affecting pregnancy, antepartum 01/17/2019   BMI 40.0-44.9, adult ([facility]) 01/17/2019   Supervision of high risk pregnancy, antepartum 07/26/2017   History of shoulder dystocia in prior pregnancy, currently pregnant 07/26/2017          Physical Exam   Triage Vital Signs: ED Triage Vitals  Encounter Vitals Group     BP 07/29/22 0857 139/88     Systolic BP Percentile --      Diastolic BP Percentile --      Pulse Rate 07/29/22 0856 80     Resp 07/29/22 0856 16     Temp 07/29/22 0856 99.4 F (37.4 C)     Temp Source 07/29/22 0856 Oral     SpO2 07/29/22 0856 100 %     Weight 07/29/22 0856 293 lb 3.4 oz (133 kg)     Height 07/29/22 0856 5\' 6"  (1.676 m)     Head Circumference --      Peak Flow --      Pain Score 07/29/22 0856 10     Pain Loc --      Pain Education --      Exclude from Growth Chart --     Most recent vital  signs: Vitals:   07/29/22 0856 07/29/22 0857  BP:  139/88  Pulse: 80   Resp: 16   Temp: 99.4 F (37.4 C)   SpO2: 100%     Physical Exam Vitals and nursing note reviewed.  Constitutional:      General: Awake and alert. No acute distress.  Resting comfortably on the stretcher    Appearance: Normal appearance. The patient is obese.  HENT:     Head: Normocephalic and atraumatic.     Mouth: Mucous membranes are moist.  Eyes:     General: PERRL. Normal EOMs        Right eye: No discharge.        Left eye: No discharge.     Conjunctiva/sclera: Conjunctivae normal.  Cardiovascular:     Rate and Rhythm: Normal rate and regular rhythm.     Pulses: Normal pulses.  Pulmonary:     Effort: Pulmonary effort is normal. No respiratory distress.     Breath sounds: Normal breath sounds.  Abdominal:     Abdomen is soft. There is no abdominal tenderness. No rebound or  guarding. No distention. Musculoskeletal:        General: No swelling. Normal range of motion.     Cervical back: Normal range of motion and neck supple.  No appreciable swelling noted. No erythema, ecchymosis, or wounds.  Compartments soft and compressible throughout.  Normal distal pulses.  Normal range of motion of hip, knee, ankle.  Sensation intact light touch throughout. Skin:    General: Skin is warm and dry.     Capillary Refill: Capillary refill takes less than 2 seconds.     Findings: No rash.  Neurological:     Mental Status: The patient is awake and alert.      ED Results / Procedures / Treatments   Labs (all labs ordered are listed, but only abnormal results are displayed) Labs Reviewed - No data to display   EKG     RADIOLOGY     PROCEDURES:  Critical Care performed:   Procedures   MEDICATIONS ORDERED IN ED: Medications - No data to display   IMPRESSION / MDM / ASSESSMENT AND PLAN / ED COURSE  I reviewed the triage vital signs and the nursing notes.   Differential diagnosis includes,  but is not limited to, muscle strain, muscle spasm, DVT, radiculopathy.  Patient is awake and alert, hemodynamically stable and neurovascularly intact.  Her compartments are soft and compressible throughout, no pain out of proportion.  Normal distal pulses.  Normal sensation.  No skin changes, not consistent with compartment syndrome.  Ultrasound obtained for further evaluation.  This is negative for DVT or other abnormalities patient is able with a steady gait, and nontoxic in appearance.  Most likely musculoskeletal in etiology.  She understands and agrees with plan.  She was discharged in stable condition.   Patient's presentation is most consistent with acute complicated illness / injury requiring diagnostic workup.      FINAL CLINICAL IMPRESSION(S) / ED DIAGNOSES   Final diagnoses:  Right leg pain     Rx / DC Orders   ED Discharge Orders          Ordered    naproxen (NAPROSYN) 500 MG tablet  2 times daily with meals,   Status:  Discontinued        07/29/22 1028    naproxen (NAPROSYN) 500 MG tablet  2 times daily with meals        07/29/22 1029             Note:  This document was prepared using Dragon voice recognition software and may include unintentional dictation errors.   Keturah Shavers 07/29/22 1336    Chesley Noon, MD 07/30/22 1525

## 2022-07-29 NOTE — ED Notes (Signed)
See triage note  Presents with pain to right thigh  States pain started 2 days ago w/o injury increased pain with ambulation and ambulates with sl limp
